# Patient Record
Sex: Male | Born: 1980 | Race: Black or African American | Hispanic: No | Marital: Single | Smoking: Current some day smoker
Health system: Southern US, Community
[De-identification: ages and names within clinical notes are randomized; demographics above are authoritative.]

## PROBLEM LIST (undated history)

## (undated) DIAGNOSIS — I10 Essential (primary) hypertension: Secondary | ICD-10-CM

## (undated) DIAGNOSIS — F319 Bipolar disorder, unspecified: Secondary | ICD-10-CM

## (undated) DIAGNOSIS — E119 Type 2 diabetes mellitus without complications: Secondary | ICD-10-CM

## (undated) DIAGNOSIS — F32A Depression, unspecified: Secondary | ICD-10-CM

## (undated) NOTE — *Deleted (*Deleted)
[facility] Emergency Department Provider Note  ____________________________________________   First MD Initiated Contact with Patient 03/23/20 2301     (approximate)  I have reviewed the triage vital signs and the nursing notes.   HISTORY  Chief Complaint Suicidal  Level 5 caveat: History is limited by the patient's lack of cooperation.  HPI Austin Schneider is a 80 y.o. male with medical history as listed below who presents with a complaint of not having anything to live for and having suicidal thoughts.  He said that the thoughts started today.  He is not saying whether or not he has a plan.  He said he is thinking about hurting other people too, but is not specific  about whom he is having these thoughts.  He says that nothing in particular happened today to make him feel this way, he is just "tired of it".  He said that he has nothing to live for.  He denies any current medical symptoms including fever, sore throat, chest pain or shortness of breath, nausea, vomiting, and abdominal pain.  He mostly just wants me to leave him alone to let him sleep.  He said that he is supposed to take medicine for bipolar disorder but that he is not currently taking anything.  He says it is because no one has given him anything to take.  He initially cannot quantify the severity of the symptoms but nodded when asked if he feels that his symptoms are severe.  Nothing particular seems to make his symptoms better or worse.        Past Medical History:  Diagnosis Date  . Bipolar 1 disorder (HCC)   . Depression     There are no problems to display for this patient.   History reviewed. No pertinent surgical history.  Prior to Admission medications   Not on File    Allergies Patient has no known allergies.  History reviewed. No pertinent family history.  Social History Social History   Tobacco Use  . Smoking status: Current Some Day Smoker  . Smokeless tobacco:  Never Used  Substance Use Topics  . Alcohol use: Not Currently  . Drug use: Not on file    Review of Systems Level 5 caveat: Review of systems limited by the patient's lack of cooperation.    Constitutional: No fever/chills Eyes: No visual changes. ENT: No sore throat. Cardiovascular: Denies chest pain. Respiratory: Denies shortness of breath. Gastrointestinal: No abdominal pain.  No nausea, no vomiting.  No diarrhea.  No constipation. Genitourinary: Negative for dysuria. Musculoskeletal: Negative for neck pain.  Negative for back pain. Integumentary: Negative for rash. Neurological: Negative for headaches, focal weakness or numbness. Psychiatric: +SI, +HI  ____________________________________________   PHYSICAL EXAM:  VITAL SIGNS: ED Triage Vitals  Enc Vitals Group     BP 03/23/20 2233 134/84     Pulse Rate 03/23/20 2233 88     Resp 03/23/20 2233 16     Temp 03/23/20 2233 97.8 F (36.6 C)     Temp Source 03/23/20 2233 Oral     SpO2 03/23/20 2233 94 %     Weight 03/23/20 2234 127 kg (280 lb)     Height 03/23/20 2234 1.651 m (5\' 5" )     Head Circumference --      Peak Flow --      Pain Score 03/23/20 2234 2     Pain Loc --      Pain Edu? --  Excl. in GC? --     Constitutional: Alert and oriented.  Eyes: Conjunctivae are normal.  Head: Atraumatic. Nose: No congestion/rhinnorhea. Mouth/Throat: Patient is wearing a mask. Neck: No stridor.  No meningeal signs.   Cardiovascular: Normal rate, regular rhythm. Good peripheral circulation. Grossly normal heart sounds. Respiratory: Normal respiratory effort.  No retractions. Gastrointestinal: Soft and nontender. No distention.  Musculoskeletal: No lower extremity tenderness nor edema. No gross deformities of extremities. Neurologic:  Normal speech and language. No gross focal neurologic deficits are appreciated.  Skin:  Skin is warm, dry and intact. Psychiatric: Mood and affect are flat and blunted.  Endorses SI,  some HI, no specific plan for either.  Denies drug and alcohol use.  ____________________________________________   LABS (all labs ordered are listed, but only abnormal results are displayed)  Labs Reviewed  COMPREHENSIVE METABOLIC PANEL - Abnormal; Notable for the following components:      Result Value   Creatinine, Ser 1.39 (*)    All other components within normal limits  SALICYLATE LEVEL - Abnormal; Notable for the following components:   Salicylate Lvl <7.0 (*)    All other components within normal limits  ACETAMINOPHEN LEVEL - Abnormal; Notable for the following components:   Acetaminophen (Tylenol), Serum <10 (*)    All other components within normal limits  CBC - Abnormal; Notable for the following components:   RBC 3.64 (*)    Hemoglobin 11.4 (*)    HCT 34.6 (*)    All other components within normal limits  RESPIRATORY PANEL BY RT PCR (FLU A&B, COVID)  ETHANOL  URINE DRUG SCREEN, QUALITATIVE ([facility] ONLY)   ____________________________________________  EKG  No indication for emergent EKG ____________________________________________  RADIOLOGY Marylou Mccoy, personally viewed and evaluated these images (plain radiographs) as part of my medical decision making, as well as reviewing the written report by the radiologist.  ED MD interpretation: No indication for emergent imaging  Official radiology report(s): No results found.  ____________________________________________   PROCEDURES   Procedure(s) performed (including Critical Care):  Procedures   ____________________________________________   INITIAL IMPRESSION / MDM / ASSESSMENT AND PLAN / ED COURSE  As part of my medical decision making, I reviewed the following data within the electronic MEDICAL RECORD NUMBER Nursing notes reviewed and incorporated, Labs reviewed , Old chart reviewed, Notes from prior ED visits and [place] Controlled Substance Database and consult ordered from psychiatry team.    Differential diagnosis includes, but is not limited to, depression, bipolar disorder, mood disorder, adjustment disorder, medication or drug side effect.  The patient is here voluntarily and is very vague about his symptoms and concerns.  He is not so much stating that he has thoughts of killing himself is saying that he feels like there is nothing in particular to live for and that he is "tired of it".  He wants to be here and wants help and I do not feel that he meets criteria for involuntary commitment at this time.  I explained to him the process of evaluation and ask if he would please speak in greater detail to the psychiatry team and he said that he would.  There is no indication of any acute or emergent medical condition at this time.  His metabolic panel is essentially normal except for slightly elevated creatinine which could simply be the result of his muscle mass given otherwise normal labs including BUN.  Acetaminophen and salicylate and ethanol levels are normal and he has no leukocytosis.  He has not  yet provided a urine specimen for a urine drug screen.  COVID-19 test is pending.  The patient has been placed in psychiatric observation due to the need to provide a safe environment for the patient while obtaining psychiatric consultation and evaluation, as well as ongoing medical and medication management to treat the patient's condition.  The patient has not been placed under full IVC at this time.          ____________________________________________  FINAL CLINICAL IMPRESSION(S) / ED DIAGNOSES  Final diagnoses:  Depression, unspecified depression type     MEDICATIONS GIVEN DURING THIS VISIT:  Medications - No data to display   ED Discharge Orders    None      *Please note:  Manoj Enriquez was evaluated in Emergency Department on 03/23/2020 for the symptoms described in the history of present illness. He was evaluated in the context of the global COVID-19 pandemic,  which necessitated consideration that the patient might be at risk for infection with the SARS-CoV-2 virus that causes COVID-19. Institutional protocols and algorithms that pertain to the evaluation of patients at risk for COVID-19 are in a state of rapid change based on information released by regulatory bodies including the CDC and federal and state organizations. These policies and algorithms were followed during the patient's care in the ED.  Some ED evaluations and interventions may be delayed as a result of limited staffing during and after the pandemic.*  Note:  This document was prepared using Dragon voice recognition software and may include unintentional dictation errors.   [name], MD 03/23/20 2324

---

## 2009-05-11 ENCOUNTER — Emergency Department: Payer: Self-pay | Admitting: Emergency Medicine

## 2009-05-16 ENCOUNTER — Emergency Department: Payer: Self-pay | Admitting: Emergency Medicine

## 2009-07-15 ENCOUNTER — Emergency Department: Payer: Self-pay | Admitting: Internal Medicine

## 2020-03-23 ENCOUNTER — Other Ambulatory Visit: Payer: Self-pay

## 2020-03-23 ENCOUNTER — Emergency Department
Admission: EM | Admit: 2020-03-23 | Discharge: 2020-03-26 | Disposition: A | Discharge status: Other Acute Inpatient Hospital | Payer: Self-pay | Attending: Emergency Medicine | Admitting: Emergency Medicine

## 2020-03-23 ENCOUNTER — Encounter: Payer: Self-pay | Admitting: Emergency Medicine

## 2020-03-23 DIAGNOSIS — F32A Depression, unspecified: Secondary | ICD-10-CM

## 2020-03-23 DIAGNOSIS — F17200 Nicotine dependence, unspecified, uncomplicated: Secondary | ICD-10-CM | POA: Insufficient documentation

## 2020-03-23 DIAGNOSIS — Z20822 Contact with and (suspected) exposure to covid-19: Secondary | ICD-10-CM | POA: Insufficient documentation

## 2020-03-23 DIAGNOSIS — R45851 Suicidal ideations: Secondary | ICD-10-CM | POA: Insufficient documentation

## 2020-03-23 DIAGNOSIS — F329 Major depressive disorder, single episode, unspecified: Secondary | ICD-10-CM | POA: Insufficient documentation

## 2020-03-23 HISTORY — DX: Depression, unspecified: F32.A

## 2020-03-23 HISTORY — DX: Type 2 diabetes mellitus without complications: E11.9

## 2020-03-23 HISTORY — DX: Bipolar disorder, unspecified: F31.9

## 2020-03-23 HISTORY — DX: Essential (primary) hypertension: I10

## 2020-03-23 LAB — COMPREHENSIVE METABOLIC PANEL
ALT: 25 U/L (ref 0–44)
AST: 34 U/L (ref 15–41)
Albumin: 4.2 g/dL (ref 3.5–5.0)
Alkaline Phosphatase: 77 U/L (ref 38–126)
Anion gap: 8 (ref 5–15)
BUN: 20 mg/dL (ref 6–20)
CO2: 27 mmol/L (ref 22–32)
Calcium: 9.4 mg/dL (ref 8.9–10.3)
Chloride: 106 mmol/L (ref 98–111)
Creatinine, Ser: 1.39 mg/dL — ABNORMAL HIGH (ref 0.61–1.24)
GFR, Estimated: >60 mL/min (ref >60)
Glucose, Bld: 98 mg/dL (ref 70–99)
Potassium: 3.5 mmol/L (ref 3.5–5.1)
Sodium: 141 mmol/L (ref 135–145)
Total Bilirubin: 1.1 mg/dL (ref 0.3–1.2)
Total Protein: 7.4 g/dL (ref 6.5–8.1)

## 2020-03-23 LAB — SALICYLATE LEVEL: Salicylate Lvl: <7 mg/dL — ABNORMAL LOW (ref 7.0–30.0)

## 2020-03-23 LAB — CBC
HCT: 34.6 % — ABNORMAL LOW (ref 39.0–52.0)
Hemoglobin: 11.4 g/dL — ABNORMAL LOW (ref 13.0–17.0)
MCH: 31.3 pg (ref 26.0–34.0)
MCHC: 32.9 g/dL (ref 30.0–36.0)
MCV: 95.1 fL (ref 80.0–100.0)
Platelets: 291 10*3/uL (ref 150–400)
RBC: 3.64 MIL/uL — ABNORMAL LOW (ref 4.22–5.81)
RDW: 12.1 % (ref 11.5–15.5)
WBC: 5.6 10*3/uL (ref 4.0–10.5)
nRBC: 0 % (ref 0.0–0.2)

## 2020-03-23 LAB — ACETAMINOPHEN LEVEL: Acetaminophen (Tylenol), Serum: <10 ug/mL — ABNORMAL LOW (ref 10–30)

## 2020-03-23 LAB — ETHANOL: Alcohol, Ethyl (B): <10 mg/dL (ref <10)

## 2020-03-23 NOTE — ED Provider Notes (Addendum)
[facility] Emergency Department Provider Note  ____________________________________________   First MD Initiated Contact with Patient 03/23/20 2301     (approximate)  I have reviewed the triage vital signs and the nursing notes.   HISTORY  Chief Complaint Suicidal  Level 5 caveat: History is limited by the patient's lack of cooperation.  HPI Austin Schneider is a 39 y.o. male with medical history as listed below who presents with a complaint of not having anything to live for and having suicidal thoughts.  He said that the thoughts started today.  He is not saying whether or not he has a plan.  He said he is thinking about hurting other people too, but is not specific  about whom he is having these thoughts.  He says that nothing in particular happened today to make him feel this way, he is just "tired of it".  He said that he has nothing to live for.  He denies any current medical symptoms including fever, sore throat, chest pain or shortness of breath, nausea, vomiting, and abdominal pain.  He mostly just wants me to leave him alone to let him sleep.  He said that he is supposed to take medicine for bipolar disorder but that he is not currently taking anything.  He says it is because no one has given him anything to take.  He initially cannot quantify the severity of the symptoms but nodded when asked if he feels that his symptoms are severe.  Nothing particular seems to make his symptoms better or worse.        Past Medical History:  Diagnosis Date  . Bipolar 1 disorder (HCC)   . Depression     There are no problems to display for this patient.   History reviewed. No pertinent surgical history.  Prior to Admission medications   Not on File    Allergies Patient has no known allergies.  History reviewed. No pertinent family history.  Social History Social History   Tobacco Use  . Smoking status: Current Some Day Smoker  . Smokeless tobacco:  Never Used  Substance Use Topics  . Alcohol use: Not Currently  . Drug use: Not on file    Review of Systems Level 5 caveat: Review of systems limited by the patient's lack of cooperation.    Constitutional: No fever/chills Eyes: No visual changes. ENT: No sore throat. Cardiovascular: Denies chest pain. Respiratory: Denies shortness of breath. Gastrointestinal: No abdominal pain.  No nausea, no vomiting.  No diarrhea.  No constipation. Genitourinary: Negative for dysuria. Musculoskeletal: Negative for neck pain.  Negative for back pain. Integumentary: Negative for rash. Neurological: Negative for headaches, focal weakness or numbness. Psychiatric: +SI, +HI  ____________________________________________   PHYSICAL EXAM:  VITAL SIGNS: ED Triage Vitals  Enc Vitals Group     BP 03/23/20 2233 134/84     Pulse Rate 03/23/20 2233 88     Resp 03/23/20 2233 16     Temp 03/23/20 2233 97.8 F (36.6 C)     Temp Source 03/23/20 2233 Oral     SpO2 03/23/20 2233 94 %     Weight 03/23/20 2234 127 kg (280 lb)     Height 03/23/20 2234 1.651 m (5\' 5" )     Head Circumference --      Peak Flow --      Pain Score 03/23/20 2234 2     Pain Loc --      Pain Edu? --  Excl. in GC? --     Constitutional: Alert and oriented.  Eyes: Conjunctivae are normal.  Head: Atraumatic. Nose: No congestion/rhinnorhea. Mouth/Throat: Patient is wearing a mask. Neck: No stridor.  No meningeal signs.   Cardiovascular: Normal rate, regular rhythm. Good peripheral circulation. Grossly normal heart sounds. Respiratory: Normal respiratory effort.  No retractions. Gastrointestinal: Soft and nontender. No distention.  Musculoskeletal: No lower extremity tenderness nor edema. No gross deformities of extremities. Neurologic:  Normal speech and language. No gross focal neurologic deficits are appreciated.  Skin:  Skin is warm, dry and intact. Psychiatric: Mood and affect are flat and blunted.  Endorses SI,  some HI, no specific plan for either.  Denies drug and alcohol use.  ____________________________________________   LABS (all labs ordered are listed, but only abnormal results are displayed)  Labs Reviewed  COMPREHENSIVE METABOLIC PANEL - Abnormal; Notable for the following components:      Result Value   Creatinine, Ser 1.39 (*)    All other components within normal limits  SALICYLATE LEVEL - Abnormal; Notable for the following components:   Salicylate Lvl <7.0 (*)    All other components within normal limits  ACETAMINOPHEN LEVEL - Abnormal; Notable for the following components:   Acetaminophen (Tylenol), Serum <10 (*)    All other components within normal limits  CBC - Abnormal; Notable for the following components:   RBC 3.64 (*)    Hemoglobin 11.4 (*)    HCT 34.6 (*)    All other components within normal limits  RESPIRATORY PANEL BY RT PCR (FLU A&B, COVID)  ETHANOL  URINE DRUG SCREEN, QUALITATIVE ([facility] ONLY)   ____________________________________________  EKG  No indication for emergent EKG ____________________________________________  RADIOLOGY Marylou Mccoy, personally viewed and evaluated these images (plain radiographs) as part of my medical decision making, as well as reviewing the written report by the radiologist.  ED MD interpretation: No indication for emergent imaging  Official radiology report(s): No results found.  ____________________________________________   PROCEDURES   Procedure(s) performed (including Critical Care):  Procedures   ____________________________________________   INITIAL IMPRESSION / MDM / ASSESSMENT AND PLAN / ED COURSE  As part of my medical decision making, I reviewed the following data within the electronic MEDICAL RECORD NUMBER Nursing notes reviewed and incorporated, Labs reviewed , Old chart reviewed, Notes from prior ED visits and [place] Controlled Substance Database and consult ordered from psychiatry  team.   Differential diagnosis includes, but is not limited to, depression, bipolar disorder, mood disorder, adjustment disorder, medication or drug side effect.  The patient is here voluntarily and is very vague about his symptoms and concerns.  He is not so much stating that he has thoughts of killing himself is saying that he feels like there is nothing in particular to live for and that he is "tired of it".  He wants to be here and wants help and I do not feel that he meets criteria for involuntary commitment at this time.  I explained to him the process of evaluation and ask if he would please speak in greater detail to the psychiatry team and he said that he would.  There is no indication of any acute or emergent medical condition at this time.  His metabolic panel is essentially normal except for slightly elevated creatinine which could simply be the result of his muscle mass given otherwise normal labs including BUN.  Acetaminophen and salicylate and ethanol levels are normal and he has no leukocytosis.  He has not  yet provided a urine specimen for a urine drug screen.  COVID-19 test is pending.  The patient has been placed in psychiatric observation due to the need to provide a safe environment for the patient while obtaining psychiatric consultation and evaluation, as well as ongoing medical and medication management to treat the patient's condition.  The patient has not been placed under full IVC at this time.      Clinical Course as of Mar 24 454  Sat Mar 24, 2020  [id number] Evaluated by psych [name]) who anticipates admitting the patient for further treatment.   [CF]    Clinical Course User Index [CF] [name], MD     ____________________________________________  FINAL CLINICAL IMPRESSION(S) / ED DIAGNOSES  Final diagnoses:  Depression, unspecified depression type     MEDICATIONS GIVEN DURING THIS VISIT:  Medications - No data to display   ED Discharge Orders     None      *Please note:  Ephrem Carrick was evaluated in Emergency Department on 03/24/2020 for the symptoms described in the history of present illness. He was evaluated in the context of the global COVID-19 pandemic, which necessitated consideration that the patient might be at risk for infection with the SARS-CoV-2 virus that causes COVID-19. Institutional protocols and algorithms that pertain to the evaluation of patients at risk for COVID-19 are in a state of rapid change based on information released by regulatory bodies including the CDC and federal and state organizations. These policies and algorithms were followed during the patient's care in the ED.  Some ED evaluations and interventions may be delayed as a result of limited staffing during and after the pandemic.*  Note:  This document was prepared using Dragon voice recognition software and may include unintentional dictation errors.   [name], MD 03/23/20 2324    [name], MD 03/24/20 8938    [name], MD 03/24/20 [phone]

## 2020-03-23 NOTE — ED Notes (Signed)
Pt dressed out and the following items placed in one of one labeled bag: sweatpants, socks, yellow t shirt, black sneakers, lighter, two laffy taffy, juul podx2, juul, two pencils, miscellaneous cards, reeses candy.

## 2020-03-23 NOTE — ED Triage Notes (Signed)
Pt presents with [place] [employer] voluntary with SI and "other people too". Pt is cooperative. Pt with history of bipolar per police.

## 2020-03-24 LAB — RESPIRATORY PANEL BY RT PCR (FLU A&B, COVID)
Influenza A by PCR: NEGATIVE
Influenza B by PCR: NEGATIVE
SARS Coronavirus 2 by RT PCR: NEGATIVE

## 2020-03-24 MED ORDER — ACETAMINOPHEN 500 MG PO TABS
1000.0000 mg | ORAL_TABLET | Freq: Once | ORAL | Status: AC
  Administered 2020-03-24: 1000 mg via ORAL
  Filled 2020-03-24: qty 2

## 2020-03-24 NOTE — BH Assessment (Signed)
Writer inquired about [facility] BMU staffing/acuity for patient to be reviewed, Charge Nurse Barbara reports staffing issues for tonight 10/16 as well as in the morning 10/17 

## 2020-03-24 NOTE — ED Notes (Signed)
Pt given lunch tray.

## 2020-03-24 NOTE — ED Notes (Signed)
Pt to nurses station requesting Tylenol for headache.  EDP made aware.

## 2020-03-24 NOTE — ED Notes (Signed)
Pt given dinner tray.

## 2020-03-24 NOTE — ED Notes (Signed)
Breakfast tray given to pt 

## 2020-03-24 NOTE — ED Notes (Signed)
Nurse with order to move Patient to BHU, called report to Amy B. RN, going to room 4.

## 2020-03-24 NOTE — BH Assessment (Addendum)
Writer spoke with [place] AC Marie. Marie advised TTS to continue to follow up as pt's are still under review at this time.   

## 2020-03-24 NOTE — BH Assessment (Signed)
Writer contacted [facility] Tosin in regards to bed availability for patient, AC Tosin reports no current adult beds available and to contact back tomorrow 10/17 °

## 2020-03-24 NOTE — BH Assessment (Addendum)
Referral information for Psychiatric Hospitalization faxed to;   [facility] [phone] ext [phone]) Admission staff reports currently being on diversion, reports that [facility] might have beds available. Will refer patient to [facility]

## 2020-03-24 NOTE — BH Assessment (Signed)
Referral information for Psychiatric Hospitalization faxed to;   [facility] [phone])

## 2020-03-24 NOTE — BH Assessment (Signed)
Assessment Note  Austin Schneider is an 39 y.o. male presenting to [facility] ED voluntarily brought in by [place] [employer]. Per triage note Pt presents with [place] [employer] voluntary with SI and "other people too". Pt is cooperative. Pt with history of bipolar per police. During assessment patient appears alert and oriented x4, calm and cooperative, speech is soft and pressured, behavior is withdrawn, affect appears flat. When asked why patient was presenting to the ED patient reported "just frustrated, I'm suicidal." Patient reports that he does not currently have a plan. Patient reports that he was picked up by police from his home and reports staying with his children's godmother. When asked why patient is having thoughts to want to harm himself he reports "It's a lot but I don't want to talk about it." Patient reports that he is currently supposed to be on medication for Bipolar that he is not currently taking it. He reports currently being engaged with [facility]. He reports his last visit to the [place] being "last week" and reports currently being out of medication. Patient is now currently denying SI but reported SI upon arrival. When asked about his HI he reports "I don't know." Patient reports both AH and VH. He reports his AH "sometimes they tell me to go places, wherever I end up." He reports his VH "I see pictures, people, things." Patient reports being hospitalized "2-3 months ago" due to his Hallucinations. Patient does not currently appear to be responding to any internal or external stimuli.  Collateral information was attempted for [place] [phone] but no answer.  Per Psyc NP Lerry Liner patient is recommended for Inpatient Hospitalization  Diagnosis: Hx of Bipolar Disorder, Hx of Depression  Past Medical History:  Past Medical History:  Diagnosis Date  . Bipolar 1 disorder (HCC)   . Depression     History reviewed. No pertinent surgical history.  Family History: History reviewed. No  pertinent family history.  Social History:  reports that he has been smoking. He has never used smokeless tobacco. He reports previous alcohol use. No history on file for drug use.  Additional Social History:  Alcohol / Drug Use Pain Medications: See MAR Prescriptions: See MAR Over the Counter: See MAR History of alcohol / drug use?: No history of alcohol / drug abuse  CIWA: CIWA-Ar BP: 134/84 Pulse Rate: 88 COWS:    Allergies: No Known Allergies  Home Medications: (Not in a hospital admission)   OB/GYN Status:  No LMP for male patient.  General Assessment Data Location of Assessment: [facility] ED TTS Assessment: In system Is this a Tele or Face-to-Face Assessment?: Face-to-Face Is this an Initial Assessment or a Re-assessment for this encounter?: Initial Assessment Patient Accompanied by:: N/A Language Other than English: No Living Arrangements: Other (Comment) What gender do you identify as?: Male Marital status: Single Pregnancy Status: No Living Arrangements: Non-relatives/Friends Can pt return to current living arrangement?: Yes Admission Status: Voluntary Is patient capable of signing voluntary admission?: Yes Referral Source: Other Insurance type: None  Medical Screening Exam [facility] Walk-in ONLY) Medical Exam completed: Yes  Crisis Care Plan Living Arrangements: Non-relatives/Friends Legal Guardian: Other: (Self) Name of Psychiatrist: [facility] Name of Therapist: None  Education Status Is patient currently in school?: No Is the patient employed, unemployed or receiving disability?: Unemployed  Risk to self with the past 6 months Suicidal Ideation: No-Not Currently/Within Last 6 Months Has patient been a risk to self within the past 6 months prior to admission? : Yes Suicidal Intent:  No-Not Currently/Within Last 6 Months Has patient had any suicidal intent within the past 6 months prior to admission? : Yes Is patient at risk for suicide?: No Suicidal Plan?:  No Has patient had any suicidal plan within the past 6 months prior to admission? : No Access to Means: No What has been your use of drugs/alcohol within the last 12 months?: None reported Previous Attempts/Gestures: No How many times?: 0 Other Self Harm Risks: None Triggers for Past Attempts: None known Intentional Self Injurious Behavior: None Family Suicide History: No Recent stressful life event(s): Other (Comment) (Patient did not want to discuss) Persecutory voices/beliefs?: Yes Depression: Yes Depression Symptoms: Isolating, Loss of interest in usual pleasures, Feeling worthless/self pity Substance abuse history and/or treatment for substance abuse?: No Suicide prevention information given to non-admitted patients: Not applicable  Risk to Others within the past 6 months Homicidal Ideation: Yes-Currently Present Does patient have any lifetime risk of violence toward others beyond the six months prior to admission? : No Thoughts of Harm to Others: Yes-Currently Present Comment - Thoughts of Harm to Others: Patient reports "I don't know" Current Homicidal Intent: No-Not Currently/Within Last 6 Months Current Homicidal Plan: No Access to Homicidal Means: No Identified Victim: Patient would not report who he would hurt History of harm to others?: No Assessment of Violence: None Noted Violent Behavior Description: NOne Does patient have access to weapons?: No Criminal Charges Pending?: No Does patient have a court date: No Is patient on probation?: No  Psychosis Hallucinations: Auditory, Visual Delusions: None noted  Mental Status Report Appearance/Hygiene: In scrubs Eye Contact: Poor Motor Activity: Freedom of movement Speech: Soft, Pressured Level of Consciousness: Alert Mood: Depressed Affect: Flat Anxiety Level: None Thought Processes: Coherent Judgement: Unimpaired Orientation: Person, Place, Time, Situation, Appropriate for developmental age Obsessive  Compulsive Thoughts/Behaviors: None  Cognitive Functioning Concentration: Normal Memory: Recent Intact, Remote Intact Is patient IDD: No Insight: Poor Impulse Control: Fair Appetite: Good Have you had any weight changes? : No Change Sleep: No Change Total Hours of Sleep: 6 Vegetative Symptoms: None  ADLScreening [facility] Assessment Services) Patient's cognitive ability adequate to safely complete daily activities?: Yes Patient able to express need for assistance with ADLs?: Yes Independently performs ADLs?: Yes (appropriate for developmental age)  Prior Inpatient Therapy Prior Inpatient Therapy: Yes Prior Therapy Dates: 01/2020 Prior Therapy Facilty/Provider(s): Unknown Reason for Treatment: AH and VH  Prior Outpatient Therapy Prior Outpatient Therapy: Yes Prior Therapy Dates: Current Prior Therapy Facilty/Provider(s): [facility] Reason for Treatment: Bipolar Does patient have an ACCT team?: No Does patient have Intensive In-House Services?  : No Does patient have [facility] services? : No Does patient have P4CC services?: No  ADL Screening (condition at time of admission) Patient's cognitive ability adequate to safely complete daily activities?: Yes Is the patient deaf or have difficulty hearing?: No Does the patient have difficulty seeing, even when wearing glasses/contacts?: No Does the patient have difficulty concentrating, remembering, or making decisions?: No Patient able to express need for assistance with ADLs?: Yes Does the patient have difficulty dressing or bathing?: No Independently performs ADLs?: Yes (appropriate for developmental age) Does the patient have difficulty walking or climbing stairs?: No Weakness of Legs: None Weakness of Arms/Hands: None  Home Assistive Devices/Equipment Home Assistive Devices/Equipment: None  Therapy Consults (therapy consults require a physician order) PT Evaluation Needed: No OT Evalulation Needed: No SLP Evaluation Needed:  No Abuse/Neglect Assessment (Assessment to be complete while patient is alone) Abuse/Neglect Assessment Can Be Completed: Yes Physical Abuse:  Denies Verbal Abuse: Denies Sexual Abuse: Denies Exploitation of patient/patient's resources: Denies Self-Neglect: Denies Values / Beliefs Cultural Requests During Hospitalization: None Spiritual Requests During Hospitalization: None Consults Spiritual Care Consult Needed: No Transition of Care Team Consult Needed: No            Disposition: Per Psyc NP Rashaun Dixon patient is recommended for Inpatient Hospitalization Disposition Initial Assessment Completed for this Encounter: Yes  On Site Evaluation by:   Reviewed with Physician:    Benay Pike MS LCASA 03/24/2020 2:07 AM

## 2020-03-24 NOTE — BH Assessment (Addendum)
Referral checks:    1:25 PM Spoke with intake staff at [facility] [phone]). There are no appropriate beds at this time.   1:09 PM This Clinical research associate spoke with [facility] Hilda Lias about bed availability. Hilda Lias agreed to follow up on bed availability at a later time.

## 2020-03-25 MED ORDER — HYDROCHLOROTHIAZIDE 25 MG PO TABS
25.0000 mg | ORAL_TABLET | Freq: Every day | ORAL | Status: DC
  Administered 2020-03-25 – 2020-03-26 (×2): 25 mg via ORAL
  Filled 2020-03-25 (×2): qty 1

## 2020-03-25 NOTE — ED Notes (Signed)
Pt given a sandwich tray, sprite, and chocolate ice cream cup

## 2020-03-25 NOTE — ED Provider Notes (Signed)
Emergency Medicine Observation Re-evaluation Note  Austin Schneider is a 39 y.o. male, seen on rounds today.  Pt initially presented to the ED for complaints of Suicidal Currently, the patient is resting, no complaints.  Physical Exam  BP 119/87   Pulse 81   Temp 97.8 F (36.6 C) (Oral)   Resp 16   Ht 5\' 5"  (1.651 m)   Wt 127 kg   SpO2 95%   BMI 46.59 kg/m  Physical Exam General: nontoxic Cardiac: well perfused Lungs: even and unlabored resp Psych: calm  ED Course / MDM  EKG:  Clinical Course as of Mar 25 806  Sat Mar 24, 2020  Mar 26, 2020 Evaluated by psych 1610) who anticipates admitting the patient for further treatment.   [CF]    Clinical Course User Index [CF] [name], MD   I have reviewed the labs performed to date as well as medications administered while in observation.  Recent changes in the last 24 hours include none.  Plan  Current plan is for psych dispo. Patient is under full IVC at this time.   Austin Rose, MD 03/25/20 [phone]

## 2020-03-25 NOTE — BH Assessment (Signed)
[facility] BMU continues to have staffing issues as of 10/17 at 1pm, TTS will continue to follow-up with staffing/acuity of unit.  TTS attempted to make contact with [facility] ([phone] & [phone]) at 1:30pm and received no answer. TTS is currently unaware of bed availability at this time.   Referral information for Psychiatric Hospitalization faxed to:             [facility] ([phone]-or- [phone]),              [facility] ([phone]),              [facility] ([phone] -or- [phone]),              [facility] [phone] -or- [phone])           [phone]             [facility] ([phone] or [phone])             [facility] ([phone] or [phone])             [facility] ([phone] or [phone]),       

## 2020-03-25 NOTE — ED Notes (Signed)

## 2020-03-25 NOTE — BH Assessment (Signed)
[facility] continues to have staffing issues as of 10/17 at 8:16AM, TTS will continue to follow-up with staffing/acuity of unit   TTS attempted to make contact with [facility] at 8:19AM and received no answer. TTS will continue to follow up with [facility] regarding bed availability   

## 2020-03-25 NOTE — BH Assessment (Signed)
[facility] reports no current appropriate beds available at this time. TTS to follow up  10/16 8:27PM Previous Note- Writer inquired about [facility] BMU staffing/acuity for patient to be reviewed, Charge Nurse Britta Mccreedy reports staffing issues for tonight 10/16 as well as in the morning 10/17

## 2020-03-25 NOTE — ED Notes (Signed)
Breakfast tray given. No other needs found at this moment.  

## 2020-03-25 NOTE — ED Notes (Signed)
Report to amy, rn

## 2020-03-25 NOTE — ED Notes (Signed)
Vital signs checked. Shower offered.  

## 2020-03-25 NOTE — ED Notes (Signed)
Dinner tray given. No other needs found at this moment.  ?

## 2020-03-25 NOTE — Progress Notes (Signed)
Patient ID: Austin Schneider, male   DOB: [birth year], 39 y.o.   MRN: [id number] Very brief note  Patient remains very ill with IVC in place Awaits bed transfer ----  Note previously written by pm NP    Rama Candise Bowens MD

## 2020-03-25 NOTE — BH Assessment (Signed)
Patient currently under review with [facility] as of 4:35am 

## 2020-03-25 NOTE — ED Notes (Signed)
Patient is vol pending placement 

## 2020-03-26 NOTE — BH Assessment (Signed)
TTS faxed IVC and negative COVID results as requested to [facility] at 11:44am

## 2020-03-26 NOTE — BH Assessment (Addendum)
Patient can be admitted anytime today    [facility]  [facility]: [facility] Accepting physician is Dr.Buttar Call report [phone] Representative wasShontaye   ER Staff is aware of it: Lurena Nida, ER MD AmyPatient's Nurse  Patient's Family/Support System [place] (Mother), [phone]) have been updated as well.  Sondra Barges is asking to receive a call when pt is due to transport

## 2020-03-26 NOTE — ED Notes (Signed)
He is transferring to [facility]  - his mother Sondra Barges is on the phone with him as the [employer] Officer arrives to transport him  - mother made aware that he will be moving at this time

## 2020-03-26 NOTE — ED Notes (Signed)
SHERIFF  DEPT  CALLED  FOR  TRANSPORT  TO  [facility]   

## 2020-03-26 NOTE — BH Assessment (Signed)
[facility] Barbara advisedTTSto follow-up with bed availability in the morning due to staffing/acuity of unitand having a pt on 1:1.   TTS spoke with [facility]. Tosin advised TTS to follow up with [facility] regarding bed availability in the AM.  

## 2020-03-26 NOTE — BH Assessment (Signed)
TTS was contacted by Asencion Islam ([facility]) reporting pt to be under review.

## 2020-03-26 NOTE — BH Assessment (Addendum)
[facility] BMUcontinues to havestaffing issues as of 10/17 at8PM,TTSwill continueto follow-up with staffing/acuity of unit  [facility] at8PMand received no answer. TTS will continue to follow up with [facility] regarding bed availability.  Referral information for Psychiatric Hospitalization faxed to:  [facility] ([phone]- [phone]),Per Greig Castilla there are no appropriate male beds at this time.  [facility] [phone] agreed to call back; referrals yet to be reviewed.  [facility] [phone] -[phone]), No answer; left message for call back with [facility].   [facility] (-[phone] -or- [phone]) [phone] intake staff at this time.   [facility] [phone] or [phone] [facility] facility not accepting pts at this time.  [facility] [phone] or [phone])No intake staff until morning  [facility] [phone] or [phone]),Geropsych pts only

## 2020-04-07 ENCOUNTER — Encounter (HOSPITAL_COMMUNITY): Payer: Self-pay

## 2020-04-07 ENCOUNTER — Emergency Department (HOSPITAL_COMMUNITY)
Admission: EM | Admit: 2020-04-07 | Discharge: 2020-04-08 | Disposition: A | Discharge status: Home / Self Care | Payer: Self-pay | Attending: Emergency Medicine | Admitting: Emergency Medicine

## 2020-04-07 ENCOUNTER — Other Ambulatory Visit: Payer: Self-pay

## 2020-04-07 ENCOUNTER — Emergency Department (HOSPITAL_COMMUNITY): Payer: Self-pay

## 2020-04-07 DIAGNOSIS — Z79899 Other long term (current) drug therapy: Secondary | ICD-10-CM | POA: Insufficient documentation

## 2020-04-07 DIAGNOSIS — Z20822 Contact with and (suspected) exposure to covid-19: Secondary | ICD-10-CM | POA: Insufficient documentation

## 2020-04-07 DIAGNOSIS — Z7984 Long term (current) use of oral hypoglycemic drugs: Secondary | ICD-10-CM | POA: Insufficient documentation

## 2020-04-07 DIAGNOSIS — F319 Bipolar disorder, unspecified: Secondary | ICD-10-CM | POA: Insufficient documentation

## 2020-04-07 DIAGNOSIS — R4182 Altered mental status, unspecified: Secondary | ICD-10-CM | POA: Insufficient documentation

## 2020-04-07 DIAGNOSIS — F17200 Nicotine dependence, unspecified, uncomplicated: Secondary | ICD-10-CM | POA: Insufficient documentation

## 2020-04-07 DIAGNOSIS — E119 Type 2 diabetes mellitus without complications: Secondary | ICD-10-CM | POA: Insufficient documentation

## 2020-04-07 DIAGNOSIS — M25571 Pain in right ankle and joints of right foot: Secondary | ICD-10-CM | POA: Insufficient documentation

## 2020-04-07 DIAGNOSIS — I10 Essential (primary) hypertension: Secondary | ICD-10-CM | POA: Insufficient documentation

## 2020-04-07 DIAGNOSIS — R531 Weakness: Secondary | ICD-10-CM | POA: Insufficient documentation

## 2020-04-07 DIAGNOSIS — F329 Major depressive disorder, single episode, unspecified: Secondary | ICD-10-CM

## 2020-04-07 LAB — COMPREHENSIVE METABOLIC PANEL
ALT: 27 U/L (ref 0–44)
AST: 39 U/L (ref 15–41)
Albumin: 4.1 g/dL (ref 3.5–5.0)
Alkaline Phosphatase: 71 U/L (ref 38–126)
Anion gap: 10 (ref 5–15)
BUN: 28 mg/dL — ABNORMAL HIGH (ref 6–20)
CO2: 27 mmol/L (ref 22–32)
Calcium: 9 mg/dL (ref 8.9–10.3)
Chloride: 102 mmol/L (ref 98–111)
Creatinine, Ser: 1.11 mg/dL (ref 0.61–1.24)
GFR, Estimated: >60 mL/min (ref >60)
Glucose, Bld: 84 mg/dL (ref 70–99)
Potassium: 3.7 mmol/L (ref 3.5–5.1)
Sodium: 139 mmol/L (ref 135–145)
Total Bilirubin: 0.6 mg/dL (ref 0.3–1.2)
Total Protein: 7.4 g/dL (ref 6.5–8.1)

## 2020-04-07 LAB — CBC WITH DIFFERENTIAL/PLATELET
Abs Immature Granulocytes: 0.01 10*3/uL (ref 0.00–0.07)
Basophils Absolute: 0 10*3/uL (ref 0.0–0.1)
Basophils Relative: 1 %
Eosinophils Absolute: 0.3 10*3/uL (ref 0.0–0.5)
Eosinophils Relative: 4 %
HCT: 37.9 % — ABNORMAL LOW (ref 39.0–52.0)
Hemoglobin: 12.1 g/dL — ABNORMAL LOW (ref 13.0–17.0)
Immature Granulocytes: 0 %
Lymphocytes Relative: 28 %
Lymphs Abs: 1.8 10*3/uL (ref 0.7–4.0)
MCH: 31.8 pg (ref 26.0–34.0)
MCHC: 31.9 g/dL (ref 30.0–36.0)
MCV: 99.7 fL (ref 80.0–100.0)
Monocytes Absolute: 0.6 10*3/uL (ref 0.1–1.0)
Monocytes Relative: 9 %
Neutro Abs: 3.6 10*3/uL (ref 1.7–7.7)
Neutrophils Relative %: 58 %
Platelets: 282 10*3/uL (ref 150–400)
RBC: 3.8 MIL/uL — ABNORMAL LOW (ref 4.22–5.81)
RDW: 12.2 % (ref 11.5–15.5)
WBC: 6.2 10*3/uL (ref 4.0–10.5)
nRBC: 0 % (ref 0.0–0.2)

## 2020-04-07 LAB — SALICYLATE LEVEL: Salicylate Lvl: <7 mg/dL — ABNORMAL LOW (ref 7.0–30.0)

## 2020-04-07 LAB — ETHANOL: Alcohol, Ethyl (B): <10 mg/dL (ref <10)

## 2020-04-07 LAB — ACETAMINOPHEN LEVEL: Acetaminophen (Tylenol), Serum: <10 ug/mL — ABNORMAL LOW (ref 10–30)

## 2020-04-07 MED ORDER — ACETAMINOPHEN 325 MG PO TABS: 650.0000 mg | ORAL_TABLET | Freq: Four times a day (QID) | ORAL | Status: DC | PRN

## 2020-04-07 MED ORDER — METFORMIN HCL 500 MG PO TABS: 500.0000 mg | ORAL_TABLET | Freq: Two times a day (BID) | ORAL | Status: DC

## 2020-04-07 MED ORDER — LORAZEPAM 1 MG PO TABS: 1.0000 mg | ORAL_TABLET | ORAL | Status: DC | PRN

## 2020-04-07 MED ORDER — HYDROCHLOROTHIAZIDE 25 MG PO TABS
25.0000 mg | ORAL_TABLET | Freq: Every day | ORAL | Status: DC
  Filled 2020-04-07: qty 1

## 2020-04-07 NOTE — ED Notes (Signed)
Increased acuity from 4 to level 3 due to adding TTS and labs.

## 2020-04-07 NOTE — BH Assessment (Signed)
@   1750 TTS attempted to assess patient. He continuously fell asleep and was unable to engage in assessment.  LPC woke patient up 5-6 times before ending the telepsych session.  LPC spoke with RN, Alvino Chapel, who states she will try to assist with waking patient up for assessment. RN to contact TTS once patient is alert and able to engage.

## 2020-04-07 NOTE — ED Notes (Signed)
Pt continues to sleep, RN tried multiple times to wake him up to offer food or drink. Pt declined food or drink and went back to sleep.Will continue to monitor.

## 2020-04-07 NOTE — ED Provider Notes (Signed)
[facility]-EMERGENCY DEPT Provider Note   CSN: [id number] Arrival date & time: 04/07/20  1652     History Chief Complaint  Patient presents with  . Ankle Pain    Austin Schneider is a 39 y.o. male hx of bipolar, depression, DM, HTN, here presenting with right ankle pain and weakness and altered mental status. Patient was recently admitted to a psychiatric facility.  Patient just came out of the facility several days ago.  He states that he was trying to find his wife and get back to [facility].  He states that he heard that his wife is in jail but he has been unable to get back home.  He was found wandering and is complaining of right ankle pain.  He states that he has not been able to shower for several days. He has previous cutting behavior as well as suicidal attempts and feels very depressed right now.  He has no recent suicidal attempt but has previous suicidal attempts and multiple behavioral health admissions.  The history is provided by the patient.       Past Medical History:  Diagnosis Date  . Bipolar 1 disorder (HCC)   . Depression   . Diabetes mellitus without complication (HCC)   . Hypertension     There are no problems to display for this patient.   History reviewed. No pertinent surgical history.     History reviewed. No pertinent family history.  Social History   Tobacco Use  . Smoking status: Current Some Day Smoker  . Smokeless tobacco: Never Used  Substance Use Topics  . Alcohol use: Not Currently  . Drug use: Not on file    Home Medications Prior to Admission medications   Medication Sig Start Date End Date Taking? Authorizing Provider  hydrochlorothiazide (HYDRODIURIL) 25 MG tablet Take 25 mg by mouth daily. Patient not taking: Reported on 03/24/2020 03/10/20 04/09/20  [provider]  metFORMIN (GLUCOPHAGE) 500 MG tablet Take 500 mg by mouth 2 (two) times daily with a meal. Patient not taking: Reported  on 03/24/2020 03/10/20 04/09/20  [provider]    Allergies    Patient has no known allergies.  Review of Systems   Review of Systems  Psychiatric/Behavioral: Positive for dysphoric mood.  All other systems reviewed and are negative.   Physical Exam Updated Vital Signs BP 132/86 (BP Location: Left Arm)   Pulse 78   Temp 97.6 F (36.4 C) (Oral)   Resp (!) 96   Ht 5\' 5"  (1.651 m)   Wt 111.1 kg   SpO2 100%   BMI 40.77 kg/m   Physical Exam Vitals and nursing note reviewed.  Constitutional:      Comments: Disheveled, unkempt,  HENT:     Head: Normocephalic and atraumatic.     Nose: Nose normal.     Mouth/Throat:     Mouth: Mucous membranes are moist.  Eyes:     Extraocular Movements: Extraocular movements intact.     Pupils: Pupils are equal, round, and reactive to light.  Cardiovascular:     Rate and Rhythm: Normal rate and regular rhythm.     Pulses: Normal pulses.     Heart sounds: Normal heart sounds.  Pulmonary:     Effort: Pulmonary effort is normal.     Breath sounds: Normal breath sounds.  Abdominal:     General: Abdomen is flat.     Palpations: Abdomen is soft.  Musculoskeletal:     Cervical back:  Normal range of motion.     Comments: Right ankle slightly swollen but no obvious deformity.  No spinal tenderness.  No other extremity trauma.  Patient does have old scars from previous cutting attempts but no active bleeding or new lacerations.  Skin:    General: Skin is warm.     Capillary Refill: Capillary refill takes less than 2 seconds.  Neurological:     General: No focal deficit present.  Psychiatric:        Mood and Affect: Mood normal.     ED Results / Procedures / Treatments   Labs (all labs ordered are listed, but only abnormal results are displayed) Labs Reviewed  CBC WITH DIFFERENTIAL/PLATELET - Abnormal; Notable for the following components:      Result Value   RBC 3.80 (*)    Hemoglobin 12.1 (*)    HCT 37.9 (*)    All other  components within normal limits  COMPREHENSIVE METABOLIC PANEL - Abnormal; Notable for the following components:   BUN 28 (*)    All other components within normal limits  SALICYLATE LEVEL - Abnormal; Notable for the following components:   Salicylate Lvl <7.0 (*)    All other components within normal limits  ACETAMINOPHEN LEVEL - Abnormal; Notable for the following components:   Acetaminophen (Tylenol), Serum <10 (*)    All other components within normal limits  ETHANOL    EKG None  Radiology DG Ankle Complete Right  Result Date: 04/07/2020 CLINICAL DATA:  RIGHT ankle pain from walking around too much. EXAM: RIGHT ANKLE - COMPLETE 3+ VIEW COMPARISON:  None. FINDINGS: No acute fracture or subluxation. Note is made of pes planus. Soft tissues are unremarkable. IMPRESSION: Negative. Electronically Signed   By: Norva Pavlov M.D.   On: 04/07/2020 17:36    Procedures Procedures (including critical care time)  Medications Ordered in ED Medications - No data to display  ED Course  I have reviewed the triage vital signs and the nursing notes.  Pertinent labs & imaging results that were available during my care of the patient were reviewed by me and considered in my medical decision making (see chart for details).    MDM Rules/Calculators/A&P                         Austin Schneider is a 39 y.o. male here presenting with depression.  Patient is depressed and has previous suicidal attempts.  Patient has been wandering the streets for the last several days. Patient is also very depressed.  He was recently in behavioral health Hospital.  Will consult TTS and get psych clearance labs.  8:24 PM Labs unremarkable.  Medically cleared for psych eval.   Final Clinical Impression(s) / ED Diagnoses Final diagnoses:  None    Rx / DC Orders ED Discharge Orders    None       Charlynne Pander, MD 04/07/20 2025

## 2020-04-07 NOTE — ED Triage Notes (Signed)
PER EMS [place]. PT states he was looking for his wife who is in jail and hurt his ankle.  CBG 79 135/87 HR 80 O2 98

## 2020-04-08 ENCOUNTER — Ambulatory Visit (HOSPITAL_COMMUNITY)
Admission: EM | Admit: 2020-04-08 | Discharge: 2020-04-09 | Disposition: A | Discharge status: Home / Self Care | Payer: Self-pay | Attending: Family | Admitting: Family

## 2020-04-08 DIAGNOSIS — F17210 Nicotine dependence, cigarettes, uncomplicated: Secondary | ICD-10-CM | POA: Insufficient documentation

## 2020-04-08 DIAGNOSIS — I10 Essential (primary) hypertension: Secondary | ICD-10-CM | POA: Insufficient documentation

## 2020-04-08 DIAGNOSIS — R4587 Impulsiveness: Secondary | ICD-10-CM | POA: Insufficient documentation

## 2020-04-08 DIAGNOSIS — F259 Schizoaffective disorder, unspecified: Secondary | ICD-10-CM

## 2020-04-08 DIAGNOSIS — Z9151 Personal history of suicidal behavior: Secondary | ICD-10-CM | POA: Insufficient documentation

## 2020-04-08 DIAGNOSIS — M25571 Pain in right ankle and joints of right foot: Secondary | ICD-10-CM | POA: Insufficient documentation

## 2020-04-08 DIAGNOSIS — E119 Type 2 diabetes mellitus without complications: Secondary | ICD-10-CM | POA: Insufficient documentation

## 2020-04-08 DIAGNOSIS — F251 Schizoaffective disorder, depressive type: Secondary | ICD-10-CM | POA: Insufficient documentation

## 2020-04-08 DIAGNOSIS — Z7901 Long term (current) use of anticoagulants: Secondary | ICD-10-CM | POA: Insufficient documentation

## 2020-04-08 DIAGNOSIS — Z79899 Other long term (current) drug therapy: Secondary | ICD-10-CM | POA: Insufficient documentation

## 2020-04-08 DIAGNOSIS — Z9152 Personal history of nonsuicidal self-harm: Secondary | ICD-10-CM | POA: Insufficient documentation

## 2020-04-08 DIAGNOSIS — R531 Weakness: Secondary | ICD-10-CM | POA: Insufficient documentation

## 2020-04-08 LAB — CBC WITH DIFFERENTIAL/PLATELET
Abs Immature Granulocytes: 0.01 10*3/uL (ref 0.00–0.07)
Basophils Absolute: 0 10*3/uL (ref 0.0–0.1)
Basophils Relative: 0 %
Eosinophils Absolute: 0.2 10*3/uL (ref 0.0–0.5)
Eosinophils Relative: 5 %
HCT: 42.1 % (ref 39.0–52.0)
Hemoglobin: 13.6 g/dL (ref 13.0–17.0)
Immature Granulocytes: 0 %
Lymphocytes Relative: 33 %
Lymphs Abs: 1.3 10*3/uL (ref 0.7–4.0)
MCH: 31.9 pg (ref 26.0–34.0)
MCHC: 32.3 g/dL (ref 30.0–36.0)
MCV: 98.6 fL (ref 80.0–100.0)
Monocytes Absolute: 0.4 10*3/uL (ref 0.1–1.0)
Monocytes Relative: 9 %
Neutro Abs: 2.1 10*3/uL (ref 1.7–7.7)
Neutrophils Relative %: 53 %
Platelets: 326 10*3/uL (ref 150–400)
RBC: 4.27 MIL/uL (ref 4.22–5.81)
RDW: 12 % (ref 11.5–15.5)
WBC: 4 10*3/uL (ref 4.0–10.5)
nRBC: 0 % (ref 0.0–0.2)

## 2020-04-08 LAB — COMPREHENSIVE METABOLIC PANEL
ALT: 28 U/L (ref 0–44)
AST: 30 U/L (ref 15–41)
Albumin: 3.8 g/dL (ref 3.5–5.0)
Alkaline Phosphatase: 74 U/L (ref 38–126)
Anion gap: 12 (ref 5–15)
BUN: 19 mg/dL (ref 6–20)
CO2: 28 mmol/L (ref 22–32)
Calcium: 9.4 mg/dL (ref 8.9–10.3)
Chloride: 100 mmol/L (ref 98–111)
Creatinine, Ser: 0.98 mg/dL (ref 0.61–1.24)
GFR, Estimated: >60 mL/min (ref >60)
Glucose, Bld: 78 mg/dL (ref 70–99)
Potassium: 3.3 mmol/L — ABNORMAL LOW (ref 3.5–5.1)
Sodium: 140 mmol/L (ref 135–145)
Total Bilirubin: 0.7 mg/dL (ref 0.3–1.2)
Total Protein: 7.1 g/dL (ref 6.5–8.1)

## 2020-04-08 LAB — RAPID URINE DRUG SCREEN, HOSP PERFORMED
Amphetamines: POSITIVE — AB
Barbiturates: NOT DETECTED
Benzodiazepines: NOT DETECTED
Cocaine: NOT DETECTED
Opiates: NOT DETECTED
Tetrahydrocannabinol: NOT DETECTED

## 2020-04-08 LAB — LIPID PANEL
Cholesterol: 243 mg/dL — ABNORMAL HIGH (ref 0–200)
HDL: 42 mg/dL (ref >40)
LDL Cholesterol: 181 mg/dL — ABNORMAL HIGH (ref 0–99)
Total CHOL/HDL Ratio: 5.8 RATIO
Triglycerides: 101 mg/dL (ref <150)
VLDL: 20 mg/dL (ref 0–40)

## 2020-04-08 LAB — TSH: TSH: 0.721 u[IU]/mL (ref 0.350–4.500)

## 2020-04-08 LAB — RESPIRATORY PANEL BY RT PCR (FLU A&B, COVID)
Influenza A by PCR: NEGATIVE
Influenza B by PCR: NEGATIVE
SARS Coronavirus 2 by RT PCR: NEGATIVE

## 2020-04-08 MED ORDER — OLANZAPINE 2.5 MG PO TABS
2.5000 mg | ORAL_TABLET | Freq: Every day | ORAL | Status: DC
  Administered 2020-04-08: 2.5 mg via ORAL
  Filled 2020-04-08: qty 1

## 2020-04-08 MED ORDER — ACETAMINOPHEN 325 MG PO TABS: 650.0000 mg | ORAL_TABLET | Freq: Four times a day (QID) | ORAL | Status: DC | PRN

## 2020-04-08 MED ORDER — TRAZODONE HCL 50 MG PO TABS: 50.0000 mg | ORAL_TABLET | Freq: Every evening | ORAL | Status: DC | PRN

## 2020-04-08 MED ORDER — FLUOXETINE HCL 10 MG PO CAPS
10.0000 mg | ORAL_CAPSULE | Freq: Every day | ORAL | Status: DC
  Administered 2020-04-09: 10 mg via ORAL
  Filled 2020-04-08: qty 1

## 2020-04-08 MED ORDER — HYDROXYZINE HCL 25 MG PO TABS
25.0000 mg | ORAL_TABLET | Freq: Three times a day (TID) | ORAL | Status: DC | PRN
  Administered 2020-04-08: 25 mg via ORAL
  Filled 2020-04-08: qty 1

## 2020-04-08 MED ORDER — ALUM & MAG HYDROXIDE-SIMETH 200-200-20 MG/5ML PO SUSP: 30.0000 mL | ORAL | Status: DC | PRN

## 2020-04-08 NOTE — BH Assessment (Signed)
Per Hillery Jacks, NP, patient to remain in observation overnight.  Pending am psych evaluation.

## 2020-04-08 NOTE — ED Provider Notes (Addendum)
Behavioral Health Admission H&P [facility] & OBS)  Date: 04/08/20 Patient Name: Austin Schneider MRN: [id number] Chief Complaint: No chief complaint on file.     Diagnoses:  Final diagnoses:  Schizoaffective disorder, unspecified type (HCC)    Austin Schneider was evaluated face-to-face.  Patient is difficult to understand throughout this assessment.  Patient is a poor historian.  He is denying suicidal or homicidal ideations.  Denies auditory or visual hallucinations.  Reports " I just need some help."  Denies alcohol use or illicit drug abuse.  " it been a few days or weeks I think."  Unable to specify any drugs or alcohol that he is used.  Chart review patient was recently discharged from [facility] 10/15, old [place] [facility] and [facility].  Patient reports ports he resides in the [facility] area.  Patient was medically cleared by emergency department.  Patient to be reassessed by psychiatry in the morning.  Consider providing medication management outpatient follow-up resources with the [facility].  Support, encouragement reassurance was provided  HPI: Per admission assessment note-Austin Schneider is a 39 y.o. male hx of bipolar, depression, DM, HTN, here presenting with right ankle pain and weakness and altered mental status. Patient was recently admitted to a psychiatric facility.  Patient just came out of the facility several days ago.  He states that he was trying to find his wife and get back to [facility].  He states that he heard that his wife is in jail but he has been unable to get back home.  He was found wandering and is complaining of right ankle pain.  He states that he has not been able to shower for several days. He has previous cutting behavior as well as suicidal attempts and feels very depressed right now.  He has no recent suicidal attempt but has previous suicidal attempts and multiple behavioral health admissions.  PHQ 2-9:     ED from 04/08/2020 in [facility] ED from 03/23/2020 in [facility]  C-SSRS RISK CATEGORY No Risk High Risk       Total Time spent with patient: 15 minutes  Musculoskeletal  Strength & Muscle Tone: within normal limits Gait & Station: normal Patient leans: N/A  Psychiatric Specialty Exam  Presentation General Appearance: Casual  Eye Contact:Good  Speech:Clear and Coherent  Speech Volume:Normal  Handedness:Right   Mood and Affect  Mood:Anxious;Depressed;Dysphoric  Affect:Congruent   Thought Process  Thought Processes:Linear  Descriptions of Associations:Loose  Orientation:Full (Time, Place and Person)  Thought Content:Rumination  Hallucinations:Hallucinations: None  Ideas of Reference:Paranoia  Suicidal Thoughts:Suicidal Thoughts: No  Homicidal Thoughts:Homicidal Thoughts: No   Sensorium  Memory:Immediate Poor;Recent Poor;Remote Poor  Judgment:Intact  Insight:Lacking   Executive Functions  Concentration:Poor  Attention Span:Poor  Recall:Poor  Fund of Knowledge:Fair  Language:Poor   Psychomotor Activity  Psychomotor Activity:Psychomotor Activity: Normal   Assets  Assets:Desire for Improvement;Housing;Transportation   Sleep  Sleep:Sleep: Poor   Physical Exam Vitals reviewed.  Neurological:     Mental Status: He is alert.  Psychiatric:        Attention and Perception: Attention normal.        Mood and Affect: Affect is labile.        Thought Content: Thought content is paranoid. Thought content does not include suicidal ideation.        Cognition and Memory: Cognition is impaired. He exhibits impaired recent memory.        Judgment: Judgment is impulsive.  ROS  There were no vitals taken for this visit. There is no height or weight on file to calculate BMI.  Past Psychiatric History:   Is the patient at risk to self? No  Has the patient been a risk to self in the past 6 months? No .     Has the patient been a risk to self within the distant past? No   Is the patient a risk to others? No   Has the patient been a risk to others in the past 6 months? No   Has the patient been a risk to others within the distant past? No   Past Medical History:  Past Medical History:  Diagnosis Date  . Bipolar 1 disorder (HCC)   . Depression   . Diabetes mellitus without complication (HCC)   . Hypertension    No past surgical history on file.  Family History: No family history on file.  Social History:  Social History   Socioeconomic History  . Marital status: Single    Spouse name: Not on file  . Number of children: Not on file  . Years of education: Not on file  . Highest education level: Not on file  Occupational History  . Not on file  Tobacco Use  . Smoking status: Current Some Day Smoker  . Smokeless tobacco: Never Used  Substance and Sexual Activity  . Alcohol use: Not Currently  . Drug use: Not on file  . Sexual activity: Not on file  Other Topics Concern  . Not on file  Social History Narrative  . Not on file   Social Determinants of Health   Financial Resource Strain:   . Difficulty of Paying Living Expenses: Not on file  Food Insecurity:   . Worried About Programme researcher, broadcasting/film/video in the Last Year: Not on file  . Ran Out of Food in the Last Year: Not on file  Transportation Needs:   . Lack of Transportation (Medical): Not on file  . Lack of Transportation (Non-Medical): Not on file  Physical Activity:   . Days of Exercise per Week: Not on file  . Minutes of Exercise per Session: Not on file  Stress:   . Feeling of Stress : Not on file  Social Connections:   . Frequency of Communication with Friends and Family: Not on file  . Frequency of Social Gatherings with Friends and Family: Not on file  . Attends Religious Services: Not on file  . Active Member of Clubs or Organizations: Not on file  . Attends Banker Meetings: Not on file  . Marital  Status: Not on file  Intimate Partner Violence:   . Fear of Current or Ex-Partner: Not on file  . Emotionally Abused: Not on file  . Physically Abused: Not on file  . Sexually Abused: Not on file    SDOH:  SDOH Screenings   Alcohol Screen:   . Last Alcohol Screening Score (AUDIT): Not on file  Depression (PHQ2-9):   . PHQ-2 Score: Not on file  Financial Resource Strain:   . Difficulty of Paying Living Expenses: Not on file  Food Insecurity:   . Worried About Programme researcher, broadcasting/film/video in the Last Year: Not on file  . Ran Out of Food in the Last Year: Not on file  Housing:   . Last Housing Risk Score: Not on file  Physical Activity:   . Days of Exercise per Week: Not on file  . Minutes of Exercise  per Session: Not on file  Social Connections:   . Frequency of Communication with Friends and Family: Not on file  . Frequency of Social Gatherings with Friends and Family: Not on file  . Attends Religious Services: Not on file  . Active Member of Clubs or Organizations: Not on file  . Attends BankerClub or Organization Meetings: Not on file  . Marital Status: Not on file  Stress:   . Feeling of Stress : Not on file  Tobacco Use: High Risk  . Smoking Tobacco Use: Current Some Day Smoker  . Smokeless Tobacco Use: Never Used  Transportation Needs:   . Freight forwarderLack of Transportation (Medical): Not on file  . Lack of Transportation (Non-Medical): Not on file    Last Labs:  Admission on 04/07/2020, Discharged on 04/08/2020  Component Date Value Ref Range Status  . WBC 04/07/2020 6.2  4.0 - 10.5 K/uL Final  . RBC 04/07/2020 3.80* 4.22 - 5.81 MIL/uL Final  . Hemoglobin 04/07/2020 12.1* 13.0 - 17.0 g/dL Final  . HCT 16/10/960410/30/2021 37.9* 39 - 52 % Final  . MCV 04/07/2020 99.7  80.0 - 100.0 fL Final  . MCH 04/07/2020 31.8  26.0 - 34.0 pg Final  . MCHC 04/07/2020 31.9  30.0 - 36.0 g/dL Final  . RDW 54/09/811910/30/2021 12.2  11.5 - 15.5 % Final  . Platelets 04/07/2020 282  150 - 400 K/uL Final  . nRBC 04/07/2020 0.0   0.0 - 0.2 % Final  . Neutrophils Relative % 04/07/2020 58  % Final  . Neutro Abs 04/07/2020 3.6  1.7 - 7.7 K/uL Final  . Lymphocytes Relative 04/07/2020 28  % Final  . Lymphs Abs 04/07/2020 1.8  0.7 - 4.0 K/uL Final  . Monocytes Relative 04/07/2020 9  % Final  . Monocytes Absolute 04/07/2020 0.6  0.1 - 1.0 K/uL Final  . Eosinophils Relative 04/07/2020 4  % Final  . Eosinophils Absolute 04/07/2020 0.3  0.0 - 0.5 K/uL Final  . Basophils Relative 04/07/2020 1  % Final  . Basophils Absolute 04/07/2020 0.0  0.0 - 0.1 K/uL Final  . Immature Granulocytes 04/07/2020 0  % Final  . Abs Immature Granulocytes 04/07/2020 0.01  0.00 - 0.07 K/uL Final   Performed at [facility], [address] [place], [place] [id number]  . Sodium 04/07/2020 139  135 - 145 mmol/L Final  . Potassium 04/07/2020 3.7  3.5 - 5.1 mmol/L Final  . Chloride 04/07/2020 102  98 - 111 mmol/L Final  . CO2 04/07/2020 27  22 - 32 mmol/L Final  . Glucose, Bld 04/07/2020 84  70 - 99 mg/dL Final   Glucose reference range applies only to samples taken after fasting for at least 8 hours.  . BUN 04/07/2020 28* 6 - 20 mg/dL Final  . Creatinine, Ser 04/07/2020 1.11  0.61 - 1.24 mg/dL Final  . Calcium 95/62/130810/30/2021 9.0  8.9 - 10.3 mg/dL Final  . Total Protein 04/07/2020 7.4  6.5 - 8.1 g/dL Final  . Albumin 65/78/469610/30/2021 4.1  3.5 - 5.0 g/dL Final  . AST 29/52/841310/30/2021 39  15 - 41 U/L Final  . ALT 04/07/2020 27  0 - 44 U/L Final  . Alkaline Phosphatase 04/07/2020 71  38 - 126 U/L Final  . Total Bilirubin 04/07/2020 0.6  0.3 - 1.2 mg/dL Final  . GFR, Estimated 04/07/2020 >60  >60 mL/min Final   Comment: (NOTE) Calculated using the CKD-EPI Creatinine Equation (2021)   . Anion gap 04/07/2020 10  5 -  15 Final   Performed at [facility], [address] [place], [place] [address]  . Alcohol, Ethyl (B) 04/07/2020 <10  <10 mg/dL Final   Comment: (NOTE) Lowest detectable limit for serum alcohol is 10 mg/dL.  For  medical purposes only. Performed at [facility], [address], [place], [place] [address]   . Salicylate Lvl 04/07/2020 <7.0* 7.0 - 30.0 mg/dL Final   Performed at [facility], [address], [place], [place] [address]  . Acetaminophen (Tylenol), Serum 04/07/2020 <10* 10 - 30 ug/mL Final   Comment: (NOTE) Therapeutic concentrations vary significantly. A range of 10-30 ug/mL  may be an effective concentration for many patients. However, some  are best treated at concentrations outside of this range. Acetaminophen concentrations >150 ug/mL at 4 hours after ingestion  and >50 ug/mL at 12 hours after ingestion are often associated with  toxic reactions.  Performed at [facility], [address] [place], [place] [address]   . SARS Coronavirus 2 by RT PCR 04/08/2020 NEGATIVE  NEGATIVE Final   Comment: (NOTE) SARS-CoV-2 target nucleic acids are NOT DETECTED.  The SARS-CoV-2 RNA is generally detectable in upper respiratoy specimens during the acute phase of infection. The lowest concentration of SARS-CoV-2 viral copies this assay can detect is 131 copies/mL. A negative result does not preclude SARS-Cov-2 infection and should not be used as the sole basis for treatment or other patient management decisions. A negative result may occur with  improper specimen collection/handling, submission of specimen other than nasopharyngeal swab, presence of viral mutation(s) within the areas targeted by this assay, and inadequate number of viral copies (<131 copies/mL). A negative result must be combined with clinical observations, patient history, and epidemiological information. The expected result is Negative.  Fact Sheet for Patients:  https://www.moore.com/  Fact Sheet for Healthcare Providers:  https://www.young.biz/  This test is no                          t yet approved or cleared by the  [place] FDA and  has been authorized for detection and/or diagnosis of SARS-CoV-2 by FDA under an Emergency Use Authorization (EUA). This EUA will remain  in effect (meaning this test can be used) for the duration of the COVID-19 declaration under Section 564(b)(1) of the Act, 21 U.S.C. section 360bbb-3(b)(1), unless the authorization is terminated or revoked sooner.    . Influenza A by PCR 04/08/2020 NEGATIVE  NEGATIVE Final  . Influenza B by PCR 04/08/2020 NEGATIVE  NEGATIVE Final   Comment: (NOTE) The Xpert Xpress SARS-CoV-2/FLU/RSV assay is intended as an aid in  the diagnosis of influenza from Nasopharyngeal swab specimens and  should not be used as a sole basis for treatment. Nasal washings and  aspirates are unacceptable for Xpert Xpress SARS-CoV-2/FLU/RSV  testing.  Fact Sheet for Patients: https://www.moore.com/  Fact Sheet for Healthcare Providers: https://www.young.biz/  This test is not yet approved or cleared by the [place] FDA and  has been authorized for detection and/or diagnosis of SARS-CoV-2 by  FDA under an Emergency Use Authorization (EUA). This EUA will remain  in effect (meaning this test can be used) for the duration of the  Covid-19 declaration under Section 564(b)(1) of the Act, 21  U.S.C. section 360bbb-3(b)(1), unless the authorization is  terminated or revoked. Performed at [facility], [address], [place], [place] [address]   . Opiates 04/08/2020 NONE DETECTED  NONE DETECTED Final  . Cocaine 04/08/2020 NONE DETECTED  NONE DETECTED Final  . Benzodiazepines 04/08/2020 NONE DETECTED  NONE DETECTED Final  . Amphetamines 04/08/2020 POSITIVE* NONE DETECTED Final  . Tetrahydrocannabinol 04/08/2020 NONE DETECTED  NONE DETECTED Final  . Barbiturates 04/08/2020 NONE DETECTED  NONE DETECTED Final   Comment: (NOTE) DRUG SCREEN FOR MEDICAL PURPOSES ONLY.  IF CONFIRMATION IS NEEDED FOR  ANY PURPOSE, NOTIFY LAB WITHIN 5 DAYS.  LOWEST DETECTABLE LIMITS FOR URINE DRUG SCREEN Drug Class                     Cutoff (ng/mL) Amphetamine and metabolites    1000 Barbiturate and metabolites    200 Benzodiazepine                 200 Tricyclics and metabolites     300 Opiates and metabolites        300 Cocaine and metabolites        300 THC                            50 Performed at [facility], [address], [place], [place] [address]   Admission on 03/23/2020, Discharged on 03/26/2020  Component Date Value Ref Range Status  . Sodium 03/23/2020 141  135 - 145 mmol/L Final  . Potassium 03/23/2020 3.5  3.5 - 5.1 mmol/L Final  . Chloride 03/23/2020 106  98 - 111 mmol/L Final  . CO2 03/23/2020 27  22 - 32 mmol/L Final  . Glucose, Bld 03/23/2020 98  70 - 99 mg/dL Final   Glucose reference range applies only to samples taken after fasting for at least 8 hours.  . BUN 03/23/2020 20  6 - 20 mg/dL Final  . Creatinine, Ser 03/23/2020 1.39* 0.61 - 1.24 mg/dL Final  . Calcium 60/45/4098 9.4  8.9 - 10.3 mg/dL Final  . Total Protein 03/23/2020 7.4  6.5 - 8.1 g/dL Final  . Albumin 11/91/4782 4.2  3.5 - 5.0 g/dL Final  . AST 95/62/1308 34  15 - 41 U/L Final  . ALT 03/23/2020 25  0 - 44 U/L Final  . Alkaline Phosphatase 03/23/2020 77  38 - 126 U/L Final  . Total Bilirubin 03/23/2020 1.1  0.3 - 1.2 mg/dL Final  . GFR, Estimated 03/23/2020 >60  >60 mL/min Final  . Anion gap 03/23/2020 8  5 - 15 Final   Performed at [facility], [address], [place], [place] [address]  . Alcohol, Ethyl (B) 03/23/2020 <10  <10 mg/dL Final   Comment: (NOTE) Lowest detectable limit for serum alcohol is 10 mg/dL.  For medical purposes only. Performed at [facility], [address], [place], [place] [address]   . Salicylate Lvl 03/23/2020 <7.0* 7.0 - 30.0 mg/dL Final   Performed at [facility], [address] [place], [place] [address]  .  Acetaminophen (Tylenol), Serum 03/23/2020 <10* 10 - 30 ug/mL Final   Comment: (NOTE) Therapeutic concentrations vary significantly. A range of 10-30 ug/mL  may be an effective concentration for many patients. However, some  are best treated at concentrations outside of this range. Acetaminophen concentrations >150 ug/mL at 4 hours after ingestion  and >50 ug/mL at 12 hours after ingestion are often associated with  toxic reactions.  Performed at [facility], [address], [place], [place] [address]   . WBC 03/23/2020 5.6  4.0 - 10.5 K/uL Final  .  RBC 03/23/2020 3.64* 4.22 - 5.81 MIL/uL Final  . Hemoglobin 03/23/2020 11.4* 13.0 - 17.0 g/dL Final  . HCT 99/37/1696 34.6* 39 - 52 % Final  . MCV 03/23/2020 95.1  80.0 - 100.0 fL Final  . MCH 03/23/2020 31.3  26.0 - 34.0 pg Final  . MCHC 03/23/2020 32.9  30.0 - 36.0 g/dL Final  . RDW 78/93/8101 12.1  11.5 - 15.5 % Final  . Platelets 03/23/2020 291  150 - 400 K/uL Final  . nRBC 03/23/2020 0.0  0.0 - 0.2 % Final   Performed at [facility], [address], [place], [place] [address]  . SARS Coronavirus 2 by RT PCR 03/24/2020 NEGATIVE  NEGATIVE Final   Comment: (NOTE) SARS-CoV-2 target nucleic acids are NOT DETECTED.  The SARS-CoV-2 RNA is generally detectable in upper respiratoy specimens during the acute phase of infection. The lowest concentration of SARS-CoV-2 viral copies this assay can detect is 131 copies/mL. A negative result does not preclude SARS-Cov-2 infection and should not be used as the sole basis for treatment or other patient management decisions. A negative result may occur with  improper specimen collection/handling, submission of specimen other than nasopharyngeal swab, presence of viral mutation(s) within the areas targeted by this assay, and inadequate number of viral copies (<131 copies/mL). A negative result must be combined with clinical observations, patient history, and epidemiological  information. The expected result is Negative.  Fact Sheet for Patients:  https://www.moore.com/  Fact Sheet for Healthcare Providers:  https://www.young.biz/  This test is no                          t yet approved or cleared by the [place] FDA and  has been authorized for detection and/or diagnosis of SARS-CoV-2 by FDA under an Emergency Use Authorization (EUA). This EUA will remain  in effect (meaning this test can be used) for the duration of the COVID-19 declaration under Section 564(b)(1) of the Act, 21 U.S.C. section 360bbb-3(b)(1), unless the authorization is terminated or revoked sooner.    . Influenza A by PCR 03/24/2020 NEGATIVE  NEGATIVE Final  . Influenza B by PCR 03/24/2020 NEGATIVE  NEGATIVE Final   Comment: (NOTE) The Xpert Xpress SARS-CoV-2/FLU/RSV assay is intended as an aid in  the diagnosis of influenza from Nasopharyngeal swab specimens and  should not be used as a sole basis for treatment. Nasal washings and  aspirates are unacceptable for Xpert Xpress SARS-CoV-2/FLU/RSV  testing.  Fact Sheet for Patients: https://www.moore.com/  Fact Sheet for Healthcare Providers: https://www.young.biz/  This test is not yet approved or cleared by the [place] FDA and  has been authorized for detection and/or diagnosis of SARS-CoV-2 by  FDA under an Emergency Use Authorization (EUA). This EUA will remain  in effect (meaning this test can be used) for the duration of the  Covid-19 declaration under Section 564(b)(1) of the Act, 21  U.S.C. section 360bbb-3(b)(1), unless the authorization is  terminated or revoked. Performed at [facility], [address] [place], [place] [address]     Allergies: Patient has no known allergies.  PTA Medications: (Not in a hospital admission)   Medical Decision Making  Overnight observation patient to be reassessed by psychiatry in  the morning  restart home medication    Recommendations  Based on my evaluation the patient does not appear to have an emergency medical condition. Restarted Zyprexa 2.5 mg p.o. nightly Restarted Prozac 10 mg p.o. daily  EKG pending results Staff to continue to monitor for safety  Oneta Rack, NP 04/08/20  2:10 PM
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
## 2020-04-08 NOTE — ED Notes (Signed)
Blood drawn via venipuncture using 23g needle from left arm ac. No issues noted. Pressure bandage applied. Pt tolerated well. Will continue to monitor for safety

## 2020-04-08 NOTE — ED Notes (Signed)
Pt given cereal for breakfast. 

## 2020-04-08 NOTE — ED Notes (Signed)
Pt asleep with even and unlabored respirations. No distress or discomfort noted. Pt remains safe on the unit. Will continue to monitor. 

## 2020-04-08 NOTE — ED Notes (Signed)
[facility] called for STAT lab pickup 
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
## 2020-04-08 NOTE — BH Assessment (Addendum)
At 0234, clinician messaged [name], RN via secure chat in Epic asking if the pt is awake. Clinician waiting for nurse's reply.   Redmond Pulling, MS, [facility], [facility] Triage Specialist [phone]

## 2020-04-08 NOTE — ED Notes (Signed)
Pt sleeping@this time. Breathing even and unlabored. Will continue to monitor for safety 

## 2020-04-08 NOTE — ED Provider Notes (Signed)
TTS consultation is appreciated.  Patient can go to [facility] see once negative Covid test is obtained.  Specimen is sent for Covid screening.   Dione Booze, MD 04/08/20 [phone]

## 2020-04-08 NOTE — BH Assessment (Signed)
Clinician received a message from [name], RN via secure chat in Epic expressing he tried to wake the pt but he's still sleeping. Clinician asked the pt to contact TTS once the pt is alert and able to engage.    Redmond Pulling, MS, [facility], [facility] Triage Specialist [phone]

## 2020-04-08 NOTE — ED Notes (Signed)
Pt given frozen dinner.

## 2020-04-09 MED ORDER — FLUOXETINE HCL 10 MG PO CAPS: 10.0000 mg | ORAL_CAPSULE | Freq: Every day | ORAL | 0 refills | Status: AC

## 2020-04-09 MED ORDER — OLANZAPINE 2.5 MG PO TABS: 2.5000 mg | ORAL_TABLET | Freq: Every day | ORAL | 0 refills | Status: AC

## 2020-04-09 NOTE — ED Notes (Signed)
Pt resting on pull out with eyes closed unlabored respirations through night in no acute distress. Safety maintained.  

## 2020-04-09 NOTE — Discharge Instructions (Addendum)

## 2020-04-09 NOTE — ED Provider Notes (Signed)
FBC/OBS ASAP Discharge Summary  Date and Time: 04/09/2020 8:17 AM  Name: Austin Schneider  MRN:  [id number]   Discharge Diagnoses:  Final diagnoses:  Schizoaffective disorder, unspecified type (HCC)    Subjective: Patient reports today that he is doing little bit better.  He states that he did get stronger here in [place].  He states that he is from [place] and lives with his wife.  He says that he went to the bathroom and when he came out there was a Emergency planning/management officer standing there and his wife was gone he states that she was taken to jail and he is not sure what the charges were.  He reports that he is a disabled veteran and is followed up at the [facility]. patient reports that being stranded here has made him feel depressed.  He states that he takes Prozac and Zyprexa and that he normally gets it from the [place] but has not been able to get his medications recently.  Patient does request a prescription for this.  Patient states that if we can assist him with getting back to [facility] that this would help him out tremendously.  Patient denies any suicidal or homicidal ideations and denies any hallucinations.  Stay Summary: Patient is a 39 year old male who presented to the [facility] with reported history of bipolar disorder, depression, diabetes, hypertension and presenting with right ankle pain and weakness.  Patient was recently admitted to a psychiatric facility and presented here today reporting that he was here in [place] with his wife and she was arrested and went to jail.  He states he has tried to get back to [place], [place] where he lives.  Patient also reported that he has not been on his medications.  Patient was admitted to the continuous observation unit for overnight assessment.  Today the patient continues report that he needs assistance with getting back to [facility].  He denies any suicidal homicidal ideations and states that he can be safely provided  transportation home.  Patient is provided with resources for [facility] as well as prescriptions for his Prozac 10 mg p.o. daily and Zyprexa 2.5 mg p.o. nightly.  Patient was provided transportation via safe transport.  Total Time spent with patient: 30 minutes  Past Psychiatric History: Bipolar I, depression, substance abuse Past Medical History:  Past Medical History:  Diagnosis Date   Bipolar 1 disorder (HCC)    Depression    Diabetes mellitus without complication (HCC)    Hypertension    No past surgical history on file. Family History: No family history on file. Family Psychiatric History: None reported Social History:  Social History   Substance and Sexual Activity  Alcohol Use Not Currently     Social History   Substance and Sexual Activity  Drug Use Not on file    Social History   Socioeconomic History   Marital status: Single    Spouse name: Not on file   Number of children: Not on file   Years of education: Not on file   Highest education level: Not on file  Occupational History   Not on file  Tobacco Use   Smoking status: Current Some Day Smoker   Smokeless tobacco: Never Used  Substance and Sexual Activity   Alcohol use: Not Currently   Drug use: Not on file   Sexual activity: Not on file  Other Topics Concern   Not on file  Social History Narrative   Not  on file   Social Determinants of Health   Financial Resource Strain:    Difficulty of Paying Living Expenses: Not on file  Food Insecurity:    Worried About Running Out of Food in the Last Year: Not on file   Ran Out of Food in the Last Year: Not on file  Transportation Needs:    Lack of Transportation (Medical): Not on file   Lack of Transportation (Non-Medical): Not on file  Physical Activity:    Days of Exercise per Week: Not on file   Minutes of Exercise per Session: Not on file  Stress:    Feeling of Stress : Not on file  Social Connections:     Frequency of Communication with Friends and Family: Not on file   Frequency of Social Gatherings with Friends and Family: Not on file   Attends Religious Services: Not on file   Active Member of Clubs or Organizations: Not on file   Attends Banker Meetings: Not on file   Marital Status: Not on file   SDOH:  SDOH Screenings   Alcohol Screen:    Last Alcohol Screening Score (AUDIT): Not on file  Depression (PHQ2-9):    PHQ-2 Score: Not on file  Financial Resource Strain:    Difficulty of Paying Living Expenses: Not on file  Food Insecurity:    Worried About Programme researcher, broadcasting/film/video in the Last Year: Not on file   [employer] of Food in the Last Year: Not on file  Housing:    Last Housing Risk Score: Not on file  Physical Activity:    Days of Exercise per Week: Not on file   Minutes of Exercise per Session: Not on file  Social Connections:    Frequency of Communication with Friends and Family: Not on file   Frequency of Social Gatherings with Friends and Family: Not on file   Attends Religious Services: Not on file   Active Member of Clubs or Organizations: Not on file   Attends Banker Meetings: Not on file   Marital Status: Not on file  Stress:    Feeling of Stress : Not on file  Tobacco Use: High Risk   Smoking Tobacco Use: Current Some Day Smoker   Smokeless Tobacco Use: Never Used  Transportation Needs:    Freight forwarder (Medical): Not on file   Lack of Transportation (Non-Medical): Not on file    Has this patient used any form of tobacco in the last 30 days? (Cigarettes, Smokeless Tobacco, Cigars, and/or Pipes) A prescription for an FDA-approved tobacco cessation medication was offered at discharge and the patient refused  Current Medications:  Current Facility-Administered Medications  Medication Dose Route Frequency Provider Last Rate Last Admin   acetaminophen (TYLENOL) tablet 650 mg  650 mg Oral Q6H PRN Oneta Rack, NP       alum & mag hydroxide-simeth (MAALOX/MYLANTA) 200-200-20 MG/5ML suspension 30 mL  30 mL Oral Q4H PRN Oneta Rack, NP       FLUoxetine (PROZAC) capsule 10 mg  10 mg Oral Daily Oneta Rack, NP       hydrOXYzine (ATARAX/VISTARIL) tablet 25 mg  25 mg Oral TID PRN Oneta Rack, NP   25 mg at 04/08/20 1550   OLANZapine (ZYPREXA) tablet 2.5 mg  2.5 mg Oral QHS Oneta Rack, NP   2.5 mg at 04/08/20 2123   traZODone (DESYREL) tablet 50 mg  50 mg Oral QHS PRN Hillery Jacks  N, NP       Current Outpatient Medications  Medication Sig Dispense Refill   FLUoxetine (PROZAC) 10 MG capsule Take 1 capsule (10 mg total) by mouth daily. 30 capsule 0   FLUoxetine (PROZAC) 20 MG capsule Take 20 mg by mouth daily.     hydrochlorothiazide (HYDRODIURIL) 25 MG tablet Take 25 mg by mouth daily.      OLANZapine (ZYPREXA) 2.5 MG tablet Take 2.5 mg by mouth at bedtime.     OLANZapine (ZYPREXA) 2.5 MG tablet Take 1 tablet (2.5 mg total) by mouth at bedtime. 30 tablet 0    PTA Medications: (Not in a hospital admission)   Musculoskeletal  Strength & Muscle Tone: within normal limits Gait & Station: normal Patient leans: N/A  Psychiatric Specialty Exam  Presentation  General Appearance: Casual  Eye Contact:Fair  Speech:Clear and Coherent;Normal Rate  Speech Volume:Normal  Handedness:Right   Mood and Affect  Mood:Depressed  Affect:Appropriate;Congruent;Depressed   Thought Process  Thought Processes:Coherent  Descriptions of Associations:Intact  Orientation:Full (Time, Place and Person)  Thought Content:WDL  Hallucinations:Hallucinations: None  Ideas of Reference:None  Suicidal Thoughts:Suicidal Thoughts: No  Homicidal Thoughts:Homicidal Thoughts: No   Sensorium  Memory:Immediate Good;Recent Good;Remote Good  Judgment:Intact  Insight:Fair   Executive Functions  Concentration:Good  Attention Span:Good  Recall:Fair  Fund of  Knowledge:Good  Language:Good   Psychomotor Activity  Psychomotor Activity:Psychomotor Activity: Normal   Assets  Assets:Communication Skills;Desire for Improvement;Financial Resources/Insurance;Housing;Social Support   Sleep  Sleep:Sleep: Good   Physical Exam  Physical Exam Vitals and nursing note reviewed.  Constitutional:      Appearance: He is well-developed.  HENT:     Head: Normocephalic.  Eyes:     Pupils: Pupils are equal, round, and reactive to light.  Cardiovascular:     Rate and Rhythm: Normal rate.  Pulmonary:     Effort: Pulmonary effort is normal.  Musculoskeletal:        General: Normal range of motion.  Neurological:     Mental Status: He is alert and oriented to person, place, and time.    Review of Systems  Constitutional: Negative.   HENT: Negative.   Eyes: Negative.   Respiratory: Negative.   Cardiovascular: Negative.   Gastrointestinal: Negative.   Genitourinary: Negative.   Musculoskeletal: Negative.   Skin: Negative.   Neurological: Negative.   Endo/Heme/Allergies: Negative.   Psychiatric/Behavioral: Positive for depression and substance abuse. Negative for hallucinations and suicidal ideas.   Blood pressure (!) 127/94, pulse 79, temperature 97.9 F (36.6 C), temperature source Oral, resp. rate 18. There is no height or weight on file to calculate BMI.  Demographic Factors:  Male and Unemployed, but receives disability check  Loss Factors: NA  Historical Factors: NA  Risk Reduction Factors:   Sense of responsibility to family, Living with another person, especially a relative, Positive social support and Positive therapeutic relationship  Continued Clinical Symptoms:  Alcohol/Substance Abuse/Dependencies Previous Psychiatric Diagnoses and Treatments  Cognitive Features That Contribute To Risk:  None    Suicide Risk:  Minimal: No identifiable suicidal ideation.  Patients presenting with no risk factors but with morbid  ruminations; may be classified as minimal risk based on the severity of the depressive symptoms  Plan Of Care/Follow-up recommendations:  Continue activity as tolerated. Continue diet as recommended by your PCP. Ensure to keep all appointments with outpatient providers.  Disposition: [facility] home[facility]  Maryfrances Bunnell, FNP 04/09/2020, 8:17 AM

## 2020-04-09 NOTE — ED Notes (Signed)
Patient alert and oriented. Patient denies SI/HI. Patient is sitting on bed with eyes open. No distress noted and he voices no concerns. Monitoring continues.

## 2020-04-09 NOTE — ED Notes (Signed)
Pt sleeping@this time. Breathing even and unlabored. Will continue to monitor pt for safety 

## 2020-04-09 NOTE — ED Notes (Signed)
Nurse Discharge Note:  D:Patient denies SI/HI/AVH at this time. Pt appears calm and cooperative, and no distress noted.  A: All Personal items in locker returned to pt. Pt given AVS/ Follow-Up/Prescriptions.  Pt escorted off unit to meet safe transport driver.  R:  Pt States he will comply with outpatient services, and take medications as prescribed. 

## 2020-04-09 NOTE — ED Notes (Signed)
Patient lying in bed with eyes opened. Monitoring continues.

## 2020-12-23 IMAGING — CR DG ANKLE COMPLETE 3+V*R*
3 series · 3 of 3 positions shown · non-contrast
Comparison: None.

CLINICAL DATA: RIGHT ankle pain from walking around too much.

EXAM:
RIGHT ANKLE - COMPLETE 3+ VIEW

[x ankle ap right]
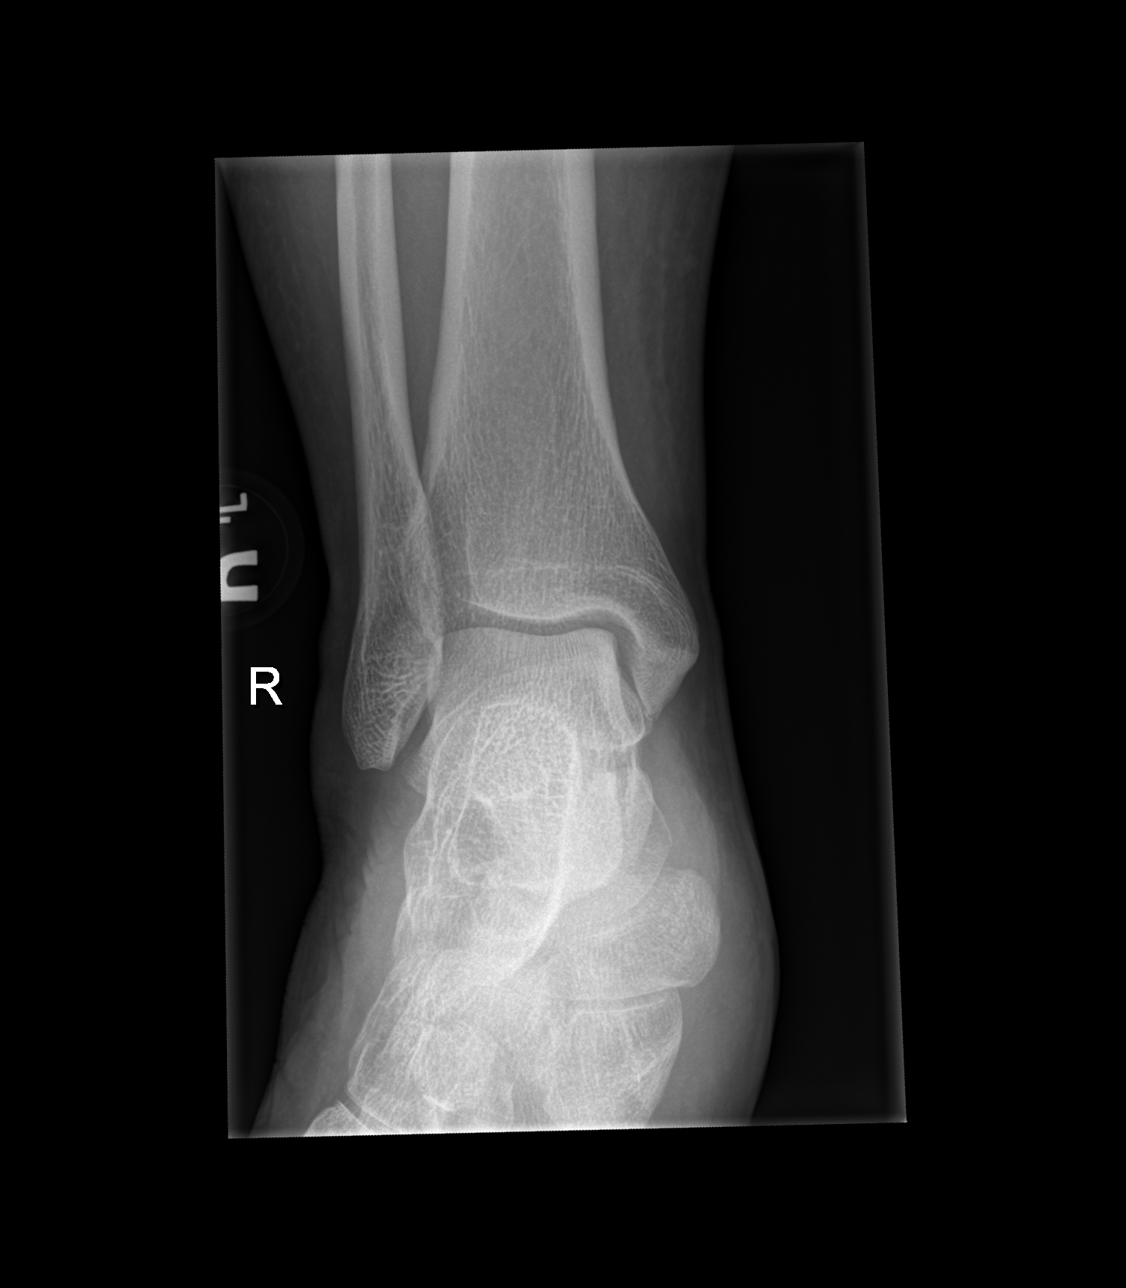

[x ankle obl right]
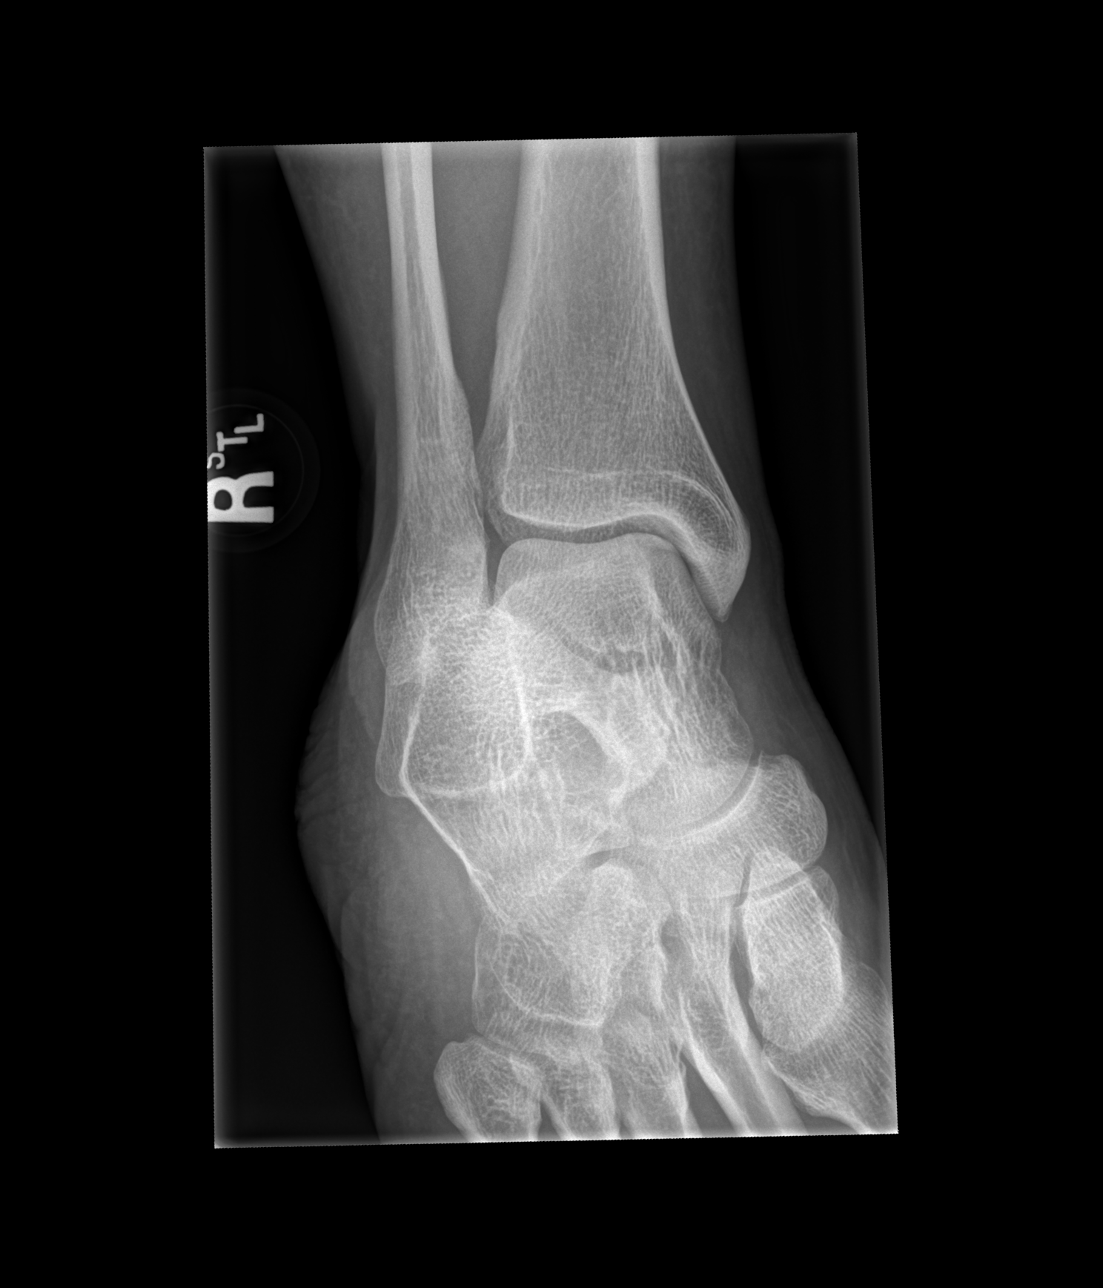

[x ankle lat right]
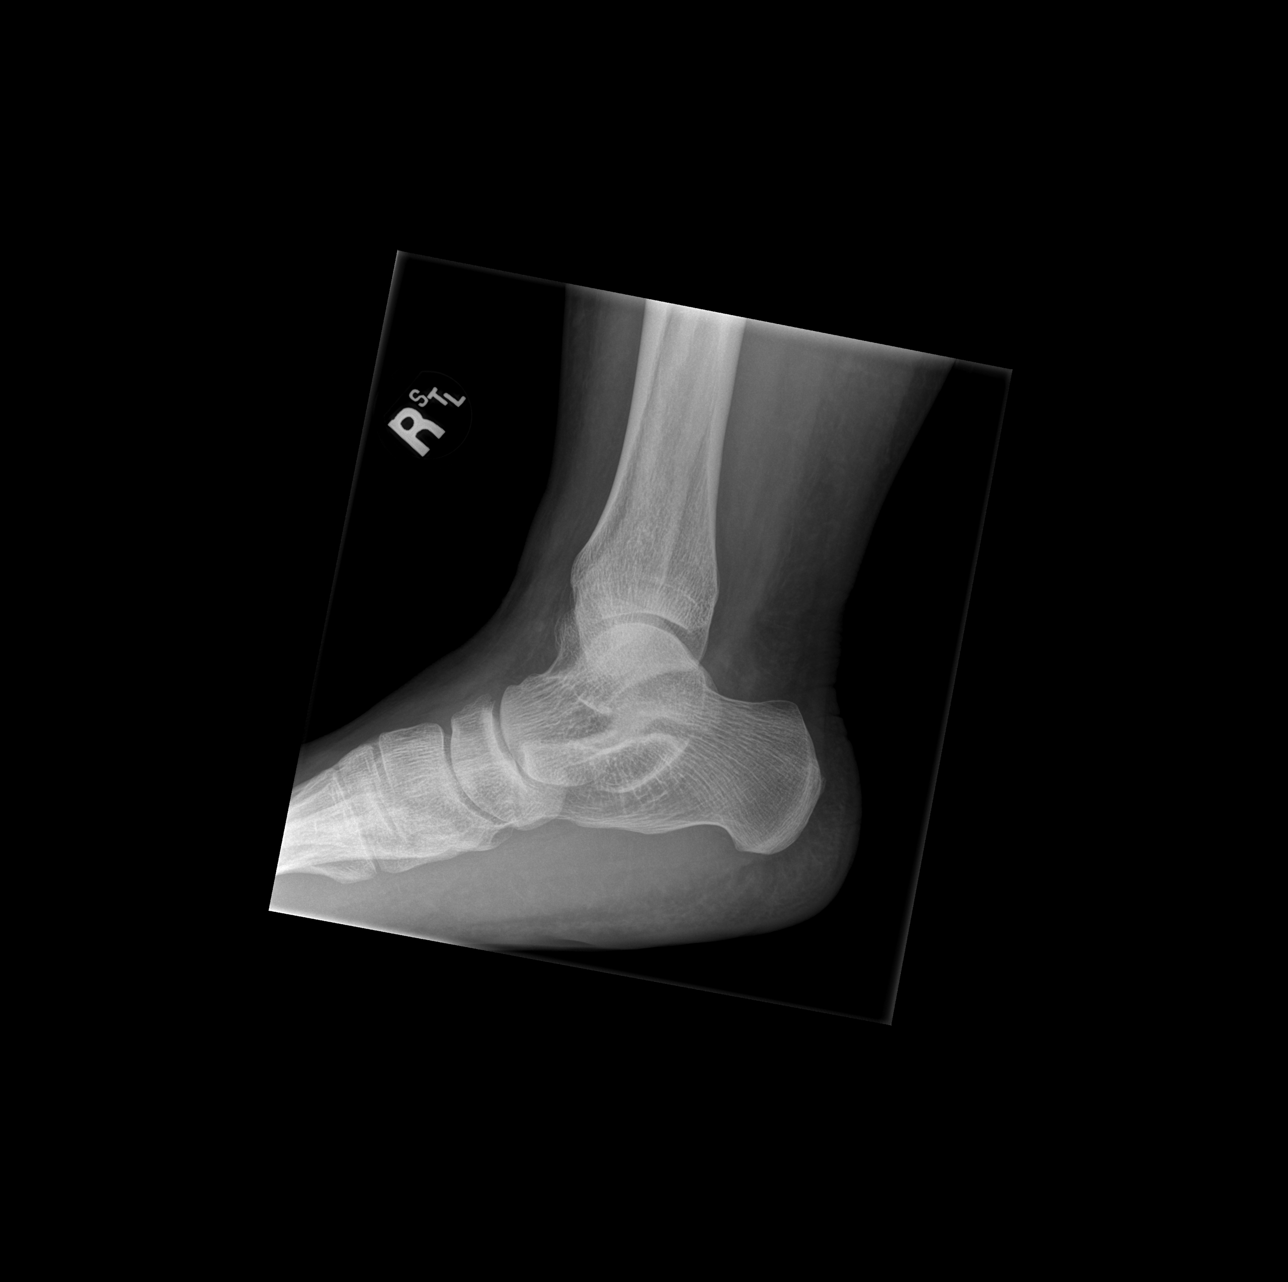

[3 of 3 positions shown; findings below may reference images not displayed]

FINDINGS: No acute fracture or subluxation. Note is made of pes planus. Soft
tissues are unremarkable.
IMPRESSION: Negative.
